# Patient Record
Sex: Female | Born: 1935
Health system: Southern US, Community
[De-identification: ages and names within clinical notes are randomized; demographics above are authoritative.]

## PROBLEM LIST (undated history)

## (undated) DIAGNOSIS — M1990 Unspecified osteoarthritis, unspecified site: Secondary | ICD-10-CM

## (undated) DIAGNOSIS — K219 Gastro-esophageal reflux disease without esophagitis: Secondary | ICD-10-CM

## (undated) DIAGNOSIS — Z8742 Personal history of other diseases of the female genital tract: Secondary | ICD-10-CM

## (undated) DIAGNOSIS — E785 Hyperlipidemia, unspecified: Secondary | ICD-10-CM

## (undated) DIAGNOSIS — N189 Chronic kidney disease, unspecified: Secondary | ICD-10-CM

## (undated) DIAGNOSIS — E039 Hypothyroidism, unspecified: Secondary | ICD-10-CM

## (undated) DIAGNOSIS — I10 Essential (primary) hypertension: Secondary | ICD-10-CM

## (undated) DIAGNOSIS — M810 Age-related osteoporosis without current pathological fracture: Secondary | ICD-10-CM

## (undated) DIAGNOSIS — R011 Cardiac murmur, unspecified: Secondary | ICD-10-CM

## (undated) HISTORY — DX: Hypothyroidism, unspecified: E03.9

## (undated) HISTORY — DX: Unspecified osteoarthritis, unspecified site: M19.90

## (undated) HISTORY — PX: JOINT REPLACEMENT: SHX530

## (undated) HISTORY — DX: Chronic kidney disease, unspecified: N18.9

## (undated) HISTORY — DX: Age-related osteoporosis without current pathological fracture: M81.0

## (undated) HISTORY — DX: Cardiac murmur, unspecified: R01.1

## (undated) HISTORY — DX: Essential (primary) hypertension: I10

## (undated) HISTORY — PX: ABDOMINAL HYSTERECTOMY: SHX81

## (undated) HISTORY — DX: Gastro-esophageal reflux disease without esophagitis: K21.9

## (undated) HISTORY — DX: Hyperlipidemia, unspecified: E78.5

## (undated) HISTORY — DX: Personal history of other diseases of the female genital tract: Z87.42

---

## 2007-05-08 ENCOUNTER — Other Ambulatory Visit: Payer: Self-pay

## 2007-05-09 ENCOUNTER — Inpatient Hospital Stay: Payer: Self-pay | Admitting: Internal Medicine

## 2011-08-17 ENCOUNTER — Ambulatory Visit: Payer: Self-pay | Admitting: Family Medicine

## 2013-08-27 ENCOUNTER — Ambulatory Visit: Payer: Self-pay | Admitting: Family Medicine

## 2014-10-21 ENCOUNTER — Ambulatory Visit: Payer: Self-pay | Admitting: Family Medicine

## 2014-11-19 DIAGNOSIS — I10 Essential (primary) hypertension: Secondary | ICD-10-CM | POA: Diagnosis not present

## 2014-12-29 DIAGNOSIS — H524 Presbyopia: Secondary | ICD-10-CM | POA: Diagnosis not present

## 2014-12-29 DIAGNOSIS — H2510 Age-related nuclear cataract, unspecified eye: Secondary | ICD-10-CM | POA: Diagnosis not present

## 2014-12-29 DIAGNOSIS — H5210 Myopia, unspecified eye: Secondary | ICD-10-CM | POA: Diagnosis not present

## 2015-01-20 DIAGNOSIS — Z0100 Encounter for examination of eyes and vision without abnormal findings: Secondary | ICD-10-CM | POA: Diagnosis not present

## 2015-03-08 IMAGING — MG MM DIGITAL SCREENING BILAT W/ CAD
1 series · 4 of 4 positions shown · non-contrast
Comparison: Previous exam(s).

CLINICAL DATA: Screening.

EXAM:
DIGITAL SCREENING BILATERAL MAMMOGRAM WITH CAD

[R CC · right · 4 of 4 slices shown]
[im 1/4]
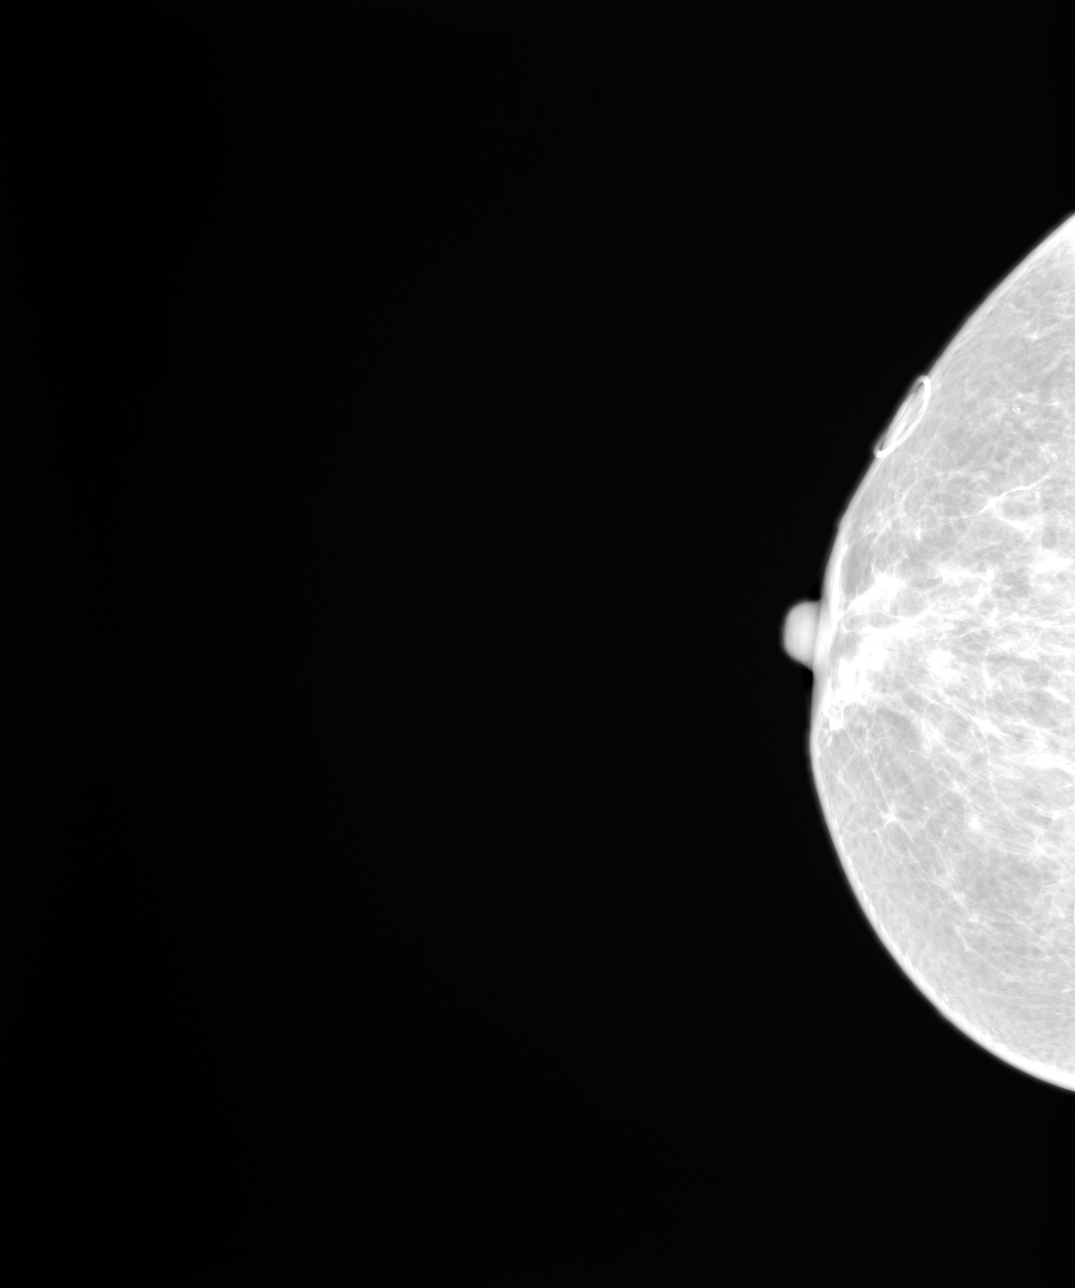
[im 2/4]
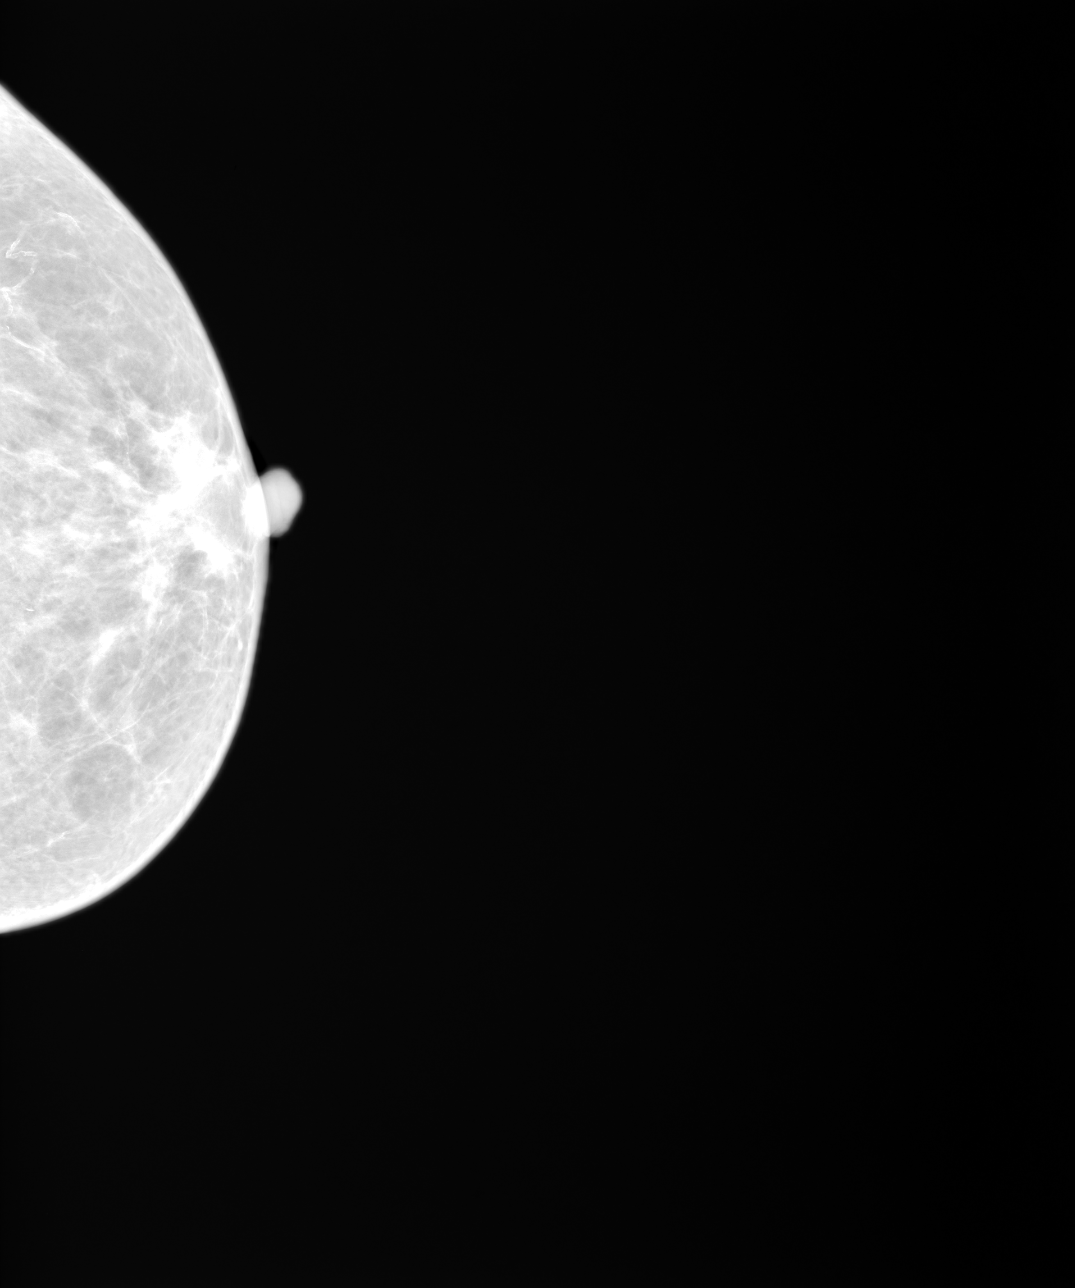
[im 3/4]
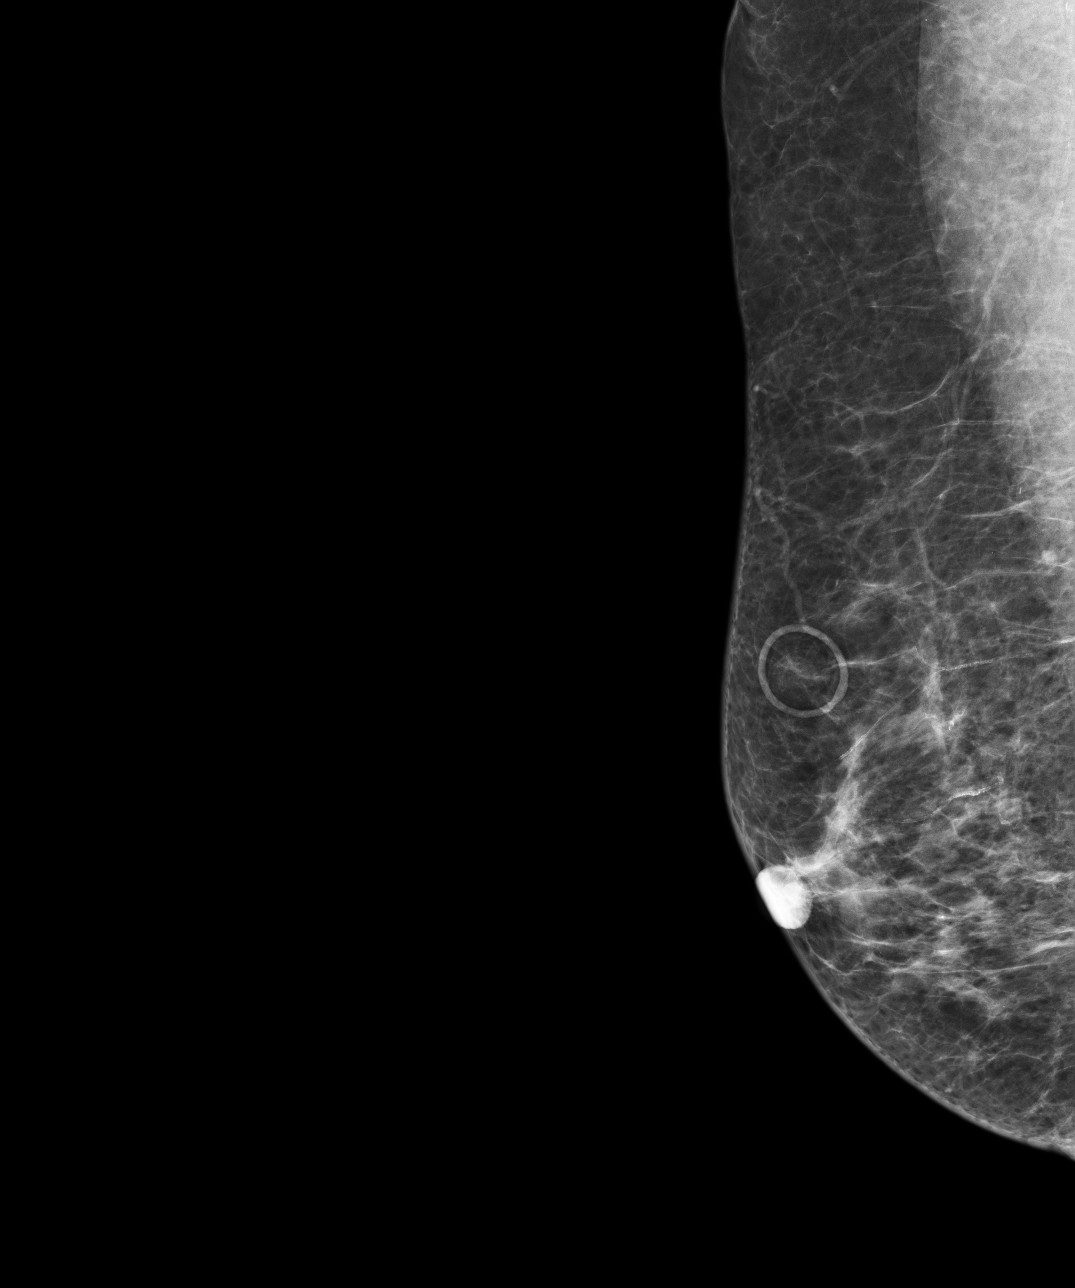
[im 4/4]
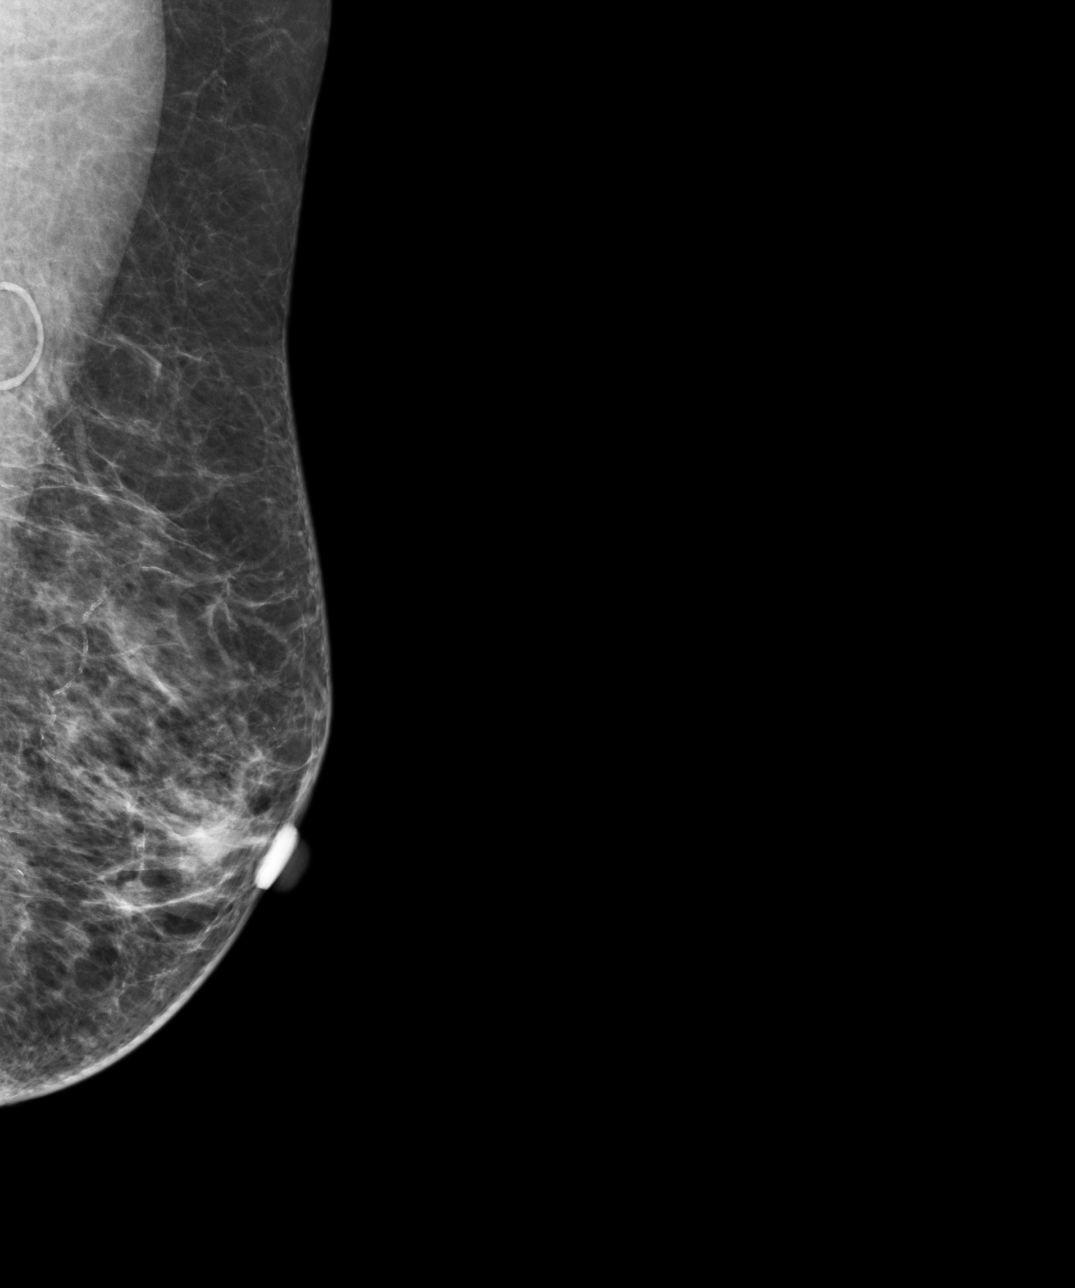

[4 of 4 positions shown; findings below may reference images not displayed]

ACR Breast Density Category c: The breasts are heterogeneously
dense, which may obscure small masses.
FINDINGS: There are no findings suspicious for malignancy. Images were
processed with CAD.
IMPRESSION: No mammographic evidence of malignancy. A result letter of this
screening mammogram will be mailed directly to the patient.

RECOMMENDATION:
Screening mammogram in one year. (Code:ZN-B-8X6)

BI-RADS CATEGORY  1: Negative

## 2015-03-15 DIAGNOSIS — E039 Hypothyroidism, unspecified: Secondary | ICD-10-CM | POA: Diagnosis not present

## 2015-03-15 DIAGNOSIS — I10 Essential (primary) hypertension: Secondary | ICD-10-CM | POA: Diagnosis not present

## 2015-03-15 DIAGNOSIS — E785 Hyperlipidemia, unspecified: Secondary | ICD-10-CM | POA: Diagnosis not present

## 2015-04-28 ENCOUNTER — Telehealth: Payer: Self-pay | Admitting: Family Medicine

## 2015-04-28 ENCOUNTER — Emergency Department
Admission: EM | Admit: 2015-04-28 | Discharge: 2015-04-28 | Disposition: A | Discharge status: Home / Self Care | Payer: Commercial Managed Care - HMO | Attending: Emergency Medicine | Admitting: Emergency Medicine

## 2015-04-28 ENCOUNTER — Ambulatory Visit (INDEPENDENT_AMBULATORY_CARE_PROVIDER_SITE_OTHER): Payer: Self-pay | Admitting: Family Medicine

## 2015-04-28 ENCOUNTER — Encounter: Payer: Self-pay | Admitting: Emergency Medicine

## 2015-04-28 ENCOUNTER — Emergency Department: Payer: Commercial Managed Care - HMO

## 2015-04-28 ENCOUNTER — Encounter: Payer: Self-pay | Admitting: Family Medicine

## 2015-04-28 VITALS — BP 171/78 | HR 57 | Temp 98.2°F | Ht 62.0 in | Wt 150.0 lb

## 2015-04-28 DIAGNOSIS — R0789 Other chest pain: Secondary | ICD-10-CM | POA: Diagnosis not present

## 2015-04-28 DIAGNOSIS — Z7982 Long term (current) use of aspirin: Secondary | ICD-10-CM | POA: Diagnosis not present

## 2015-04-28 DIAGNOSIS — N189 Chronic kidney disease, unspecified: Secondary | ICD-10-CM | POA: Insufficient documentation

## 2015-04-28 DIAGNOSIS — I10 Essential (primary) hypertension: Secondary | ICD-10-CM

## 2015-04-28 DIAGNOSIS — R011 Cardiac murmur, unspecified: Secondary | ICD-10-CM | POA: Insufficient documentation

## 2015-04-28 DIAGNOSIS — N183 Chronic kidney disease, stage 3 unspecified: Secondary | ICD-10-CM

## 2015-04-28 DIAGNOSIS — Z79899 Other long term (current) drug therapy: Secondary | ICD-10-CM | POA: Diagnosis not present

## 2015-04-28 DIAGNOSIS — K219 Gastro-esophageal reflux disease without esophagitis: Secondary | ICD-10-CM | POA: Insufficient documentation

## 2015-04-28 DIAGNOSIS — I129 Hypertensive chronic kidney disease with stage 1 through stage 4 chronic kidney disease, or unspecified chronic kidney disease: Secondary | ICD-10-CM | POA: Diagnosis not present

## 2015-04-28 DIAGNOSIS — E785 Hyperlipidemia, unspecified: Secondary | ICD-10-CM | POA: Insufficient documentation

## 2015-04-28 DIAGNOSIS — M810 Age-related osteoporosis without current pathological fracture: Secondary | ICD-10-CM | POA: Insufficient documentation

## 2015-04-28 DIAGNOSIS — E039 Hypothyroidism, unspecified: Secondary | ICD-10-CM

## 2015-04-28 DIAGNOSIS — R079 Chest pain, unspecified: Secondary | ICD-10-CM

## 2015-04-28 LAB — TROPONIN I: Troponin I: <0.03 ng/mL (ref <0.031)

## 2015-04-28 LAB — BASIC METABOLIC PANEL
Anion gap: 12 (ref 5–15)
BUN: 13 mg/dL (ref 6–20)
CHLORIDE: 105 mmol/L (ref 101–111)
CO2: 27 mmol/L (ref 22–32)
Calcium: 9.8 mg/dL (ref 8.9–10.3)
Creatinine, Ser: 1.1 mg/dL — ABNORMAL HIGH (ref 0.44–1.00)
GFR calc Af Amer: 53 mL/min — ABNORMAL LOW (ref >60)
GFR, EST NON AFRICAN AMERICAN: 46 mL/min — AB (ref >60)
GLUCOSE: 157 mg/dL — AB (ref 65–99)
POTASSIUM: 3.8 mmol/L (ref 3.5–5.1)
SODIUM: 144 mmol/L (ref 135–145)

## 2015-04-28 LAB — CBC
HCT: 37.7 % (ref 35.0–47.0)
Hemoglobin: 12 g/dL (ref 12.0–16.0)
MCH: 27.5 pg (ref 26.0–34.0)
MCHC: 31.7 g/dL — AB (ref 32.0–36.0)
MCV: 86.8 fL (ref 80.0–100.0)
PLATELETS: 240 10*3/uL (ref 150–440)
RBC: 4.35 MIL/uL (ref 3.80–5.20)
RDW: 15 % — ABNORMAL HIGH (ref 11.5–14.5)
WBC: 8.4 10*3/uL (ref 3.6–11.0)

## 2015-04-28 MED ORDER — ASPIRIN 81 MG PO CHEW
CHEWABLE_TABLET | ORAL | Status: AC
  Administered 2015-04-28: 243 mg via ORAL
  Filled 2015-04-28: qty 3

## 2015-04-28 MED ORDER — ASPIRIN 81 MG PO CHEW
243.0000 mg | CHEWABLE_TABLET | Freq: Once | ORAL | Status: AC
  Administered 2015-04-28: 243 mg via ORAL

## 2015-04-28 NOTE — ED Provider Notes (Signed)
[facility] Emergency Department Provider Note  ____________________________________________  Time seen: Approximately 2:54 PM  I have reviewed the triage vital signs and the nursing notes.   HISTORY  Chief Complaint Chest Pain    HPI Michelle Novak is a 81 y.o. female with a history of reflux, hyperlipidemia, but no known cardiac disease who presents with some intermittent, gradual onset dull pain underneath her left breast red at the bottom of her rib cage that started about 3 days ago.  She states that it is worse when she bends over and compresses her stomach.  She denies any shortness of breath, vomiting, and dysuria.  She has had some similar problems in the past but has never been found to have any cardiac issues.She has had some nausea intermittently.  She went to see her primary care doctor who reportedly had problems with her EKG machine so they sent her to the emergency department.   Past Medical History  Diagnosis Date  . Hypothyroidism   . Osteoporosis   . Hyperlipidemia   . Esophageal reflux   . Hypertension   . Chronic kidney disease   . Heart murmur   . Arthritis   . History of abnormal cervical Pap smear     Patient Active Problem List   Diagnosis Date Noted  . Hypothyroidism   . Osteoporosis   . Hyperlipidemia   . Esophageal reflux   . Hypertension   . Chronic kidney disease   . Heart murmur     Past Surgical History  Procedure Laterality Date  . Joint replacement    . Abdominal hysterectomy      Current Outpatient Rx  Name  Route  Sig  Dispense  Refill  . amitriptyline (ELAVIL) 25 MG tablet   Oral   Take 25 mg by mouth at bedtime as needed.          Marland Kitchen aspirin EC 81 MG tablet   Oral   Take 81 mg by mouth daily.         Marland Kitchen atorvastatin (LIPITOR) 10 MG tablet   Oral   Take 10 mg by mouth at bedtime.         . benazepril (LOTENSIN) 10 MG tablet   Oral   Take 10 mg by mouth daily.         Marland Kitchen  levothyroxine (SYNTHROID, LEVOTHROID) 50 MCG tablet   Oral   Take 50 mcg by mouth daily before breakfast.         . metoprolol succinate (TOPROL-XL) 50 MG 24 hr tablet   Oral   Take 50 mg by mouth daily. Take with or immediately following a meal.         . omeprazole (PRILOSEC) 20 MG capsule   Oral   Take 20 mg by mouth daily.           Allergies Review of patient's allergies indicates no known allergies.  Family History  Problem Relation Age of Onset  . Heart disease Mother   . Heart attack Mother   . Cancer Father     bone  . Heart disease Sister     Social History History  Substance Use Topics  . Smoking status: Never Smoker   . Smokeless tobacco: Never Used  . Alcohol Use: No    Review of Systems Constitutional: No fever/chills Eyes: No visual changes. ENT: No sore throat. Cardiovascular: Dull pain under her left breast at the bottom of her rib cage Respiratory: Denies shortness  of breath. Gastrointestinal: No abdominal pain.  Occasional nausea, no vomiting.  No diarrhea.  No constipation. Genitourinary: Negative for dysuria. Musculoskeletal: Negative for back pain. Skin: Negative for rash. Neurological: Negative for headaches, focal weakness or numbness.  10-point ROS otherwise negative.  ____________________________________________   PHYSICAL EXAM:  VITAL SIGNS: ED Triage Vitals  Enc Vitals Group     BP 04/28/15 1141 172/65 mmHg     Pulse Rate 04/28/15 1141 59     Resp 04/28/15 1141 18     Temp 04/28/15 1141 98.4 F (36.9 C)     Temp Source 04/28/15 1141 Oral     SpO2 04/28/15 1141 95 %     Weight 04/28/15 1141 150 lb (68.04 kg)     Height 04/28/15 1141 5\' 2"  (1.575 m)     Head Cir --      Peak Flow --      Pain Score 04/28/15 1146 0     Pain Loc --      Pain Edu? --      Excl. in [place]? --     Constitutional: Alert and oriented. Well appearing for age and in no acute distress. Eyes: Conjunctivae are normal. PERRL. EOMI. Head:  Atraumatic. Nose: No congestion/rhinnorhea. Mouth/Throat: Mucous membranes are moist.  Oropharynx non-erythematous. Neck: No stridor.   Cardiovascular: Normal rate, regular rhythm. Grossly normal heart sounds.  Good peripheral circulation. Respiratory: Normal respiratory effort.  No retractions. Lungs CTAB. Gastrointestinal: Soft and nontender. No distention. No abdominal bruits. No CVA tenderness. Musculoskeletal: No lower extremity tenderness nor edema.  No joint effusions. Neurologic:  Normal speech and language. No gross focal neurologic deficits are appreciated. Speech is normal. Skin:  Skin is warm, dry and intact. No rash noted.   ____________________________________________   LABS (all labs ordered are listed, but only abnormal results are displayed)  Labs Reviewed  CBC - Abnormal; Notable for the following:    MCHC 31.7 (*)    RDW 15.0 (*)    All other components within normal limits  BASIC METABOLIC PANEL - Abnormal; Notable for the following:    Glucose, Bld 157 (*)    Creatinine, Ser 1.10 (*)    GFR calc non Af Amer 46 (*)    GFR calc Af Amer 53 (*)    All other components within normal limits  TROPONIN I   ____________________________________________  EKG  ED ECG REPORT I, Rayvin Abid, the attending physician, personally viewed and interpreted this ECG.   Date: 04/28/2015  EKG Time: 11:45  Rate: 62  Rhythm: normal sinus rhythm  Axis: Left axis deviation  Intervals:Normal  ST&T Change: Non-specific ST segment / T-wave changes, but no evidence of acute ischemia.  ____________________________________________  RADIOLOGY  I, Karelly Dewalt, personally viewed and evaluated these images as part of my medical decision making.   Dg Chest 2 View  04/28/2015   CLINICAL DATA:  Initial encounter for discomfort and tightness under left breast for 3 days.  EXAM: CHEST  2 VIEW  COMPARISON:  05/08/2007  FINDINGS: The heart size and mediastinal contours are within  normal limits. Both lungs are clear. The visualized skeletal structures are unremarkable.  IMPRESSION: No active cardiopulmonary disease.   Electronically Signed   By: Misty Stanley M.D.   On: 04/28/2015 13:24    ____________________________________________   PROCEDURES  Procedure(s) performed: None  Critical Care performed: None ____________________________________________   INITIAL IMPRESSION / ASSESSMENT AND PLAN / ED COURSE  Pertinent labs & imaging results that were available  during my care of the patient were reviewed by me and considered in my medical decision making (see chart for details).  The patient is presenting with atypical chest pain which I suspect may actually be some gastric upset given the fact that it is located right around the location of her stomach and that it is worse when she bends over and compresses that region.  In spite of her age she has few risk factors for ACS and her pain symptoms are not consistent with ACS.  She has also been worked up for similar issues in the past.  Her workup today in the emergency department is reassuring.  I discussed this with the patient and her daughter and did offer admission, but we all agree that that is not indicated at this time.  The patient will follow up with her regular doctor at the next available appointment.  She understands my usual and customary return precautions should her symptoms get worse.   ____________________________________________  FINAL CLINICAL IMPRESSION(S) / ED DIAGNOSES  Final diagnoses:  Atypical chest pain      NEW MEDICATIONS STARTED DURING THIS VISIT:  New Prescriptions   No medications on file     Hinda Kehr, MD 04/29/15 [id number]

## 2015-04-28 NOTE — Progress Notes (Signed)
BP 171/78 mmHg  Pulse 57  Temp(Src) 98.2 F (36.8 C)  Ht 5\' 2"  (1.575 m)  Wt 150 lb (68.04 kg)  BMI 27.43 kg/m2  SpO2 99%   Subjective:    Patient ID: Michelle Novak, female    DOB: 1932-03-04, 81 y.o.   MRN: [id number]  HPI: Michelle Novak is a 81 y.o. female  Chief Complaint  Patient presents with  . Rib Pain    under breast off and on since Sunday   Pain under the left breast and got a little sore; has been off and on for the last five days;  She also had nausea on Sunday morning  Had diarrhea over the weekend on Sunday too; stomach has been "griping" Patient has no heart history but it runs in her family (son and sister and mother) Mother had a heart attack at age 78 and died (patient is 81 years old) Younger sister had open heart surgery (clarified a bypass) in her fifties I asked about any other changes in her health; she says her thyroid medicine has been changed, new problem over the last 6 months or so  Outpatient Encounter Prescriptions as of 04/28/2015  Medication Sig  . amitriptyline (ELAVIL) 25 MG tablet Take 25 mg by mouth at bedtime as needed.   Marland Kitchen aspirin EC 81 MG tablet Take 81 mg by mouth daily.  Marland Kitchen atorvastatin (LIPITOR) 10 MG tablet Take 10 mg by mouth at bedtime.  . benazepril (LOTENSIN) 10 MG tablet Take 10 mg by mouth daily.  Marland Kitchen levothyroxine (SYNTHROID, LEVOTHROID) 50 MCG tablet Take 50 mcg by mouth daily before breakfast.  . metoprolol succinate (TOPROL-XL) 50 MG 24 hr tablet Take 50 mg by mouth daily. Take with or immediately following a meal.  . omeprazole (PRILOSEC) 20 MG capsule Take 20 mg by mouth daily.   No facility-administered encounter medications on file as of 04/28/2015.     Relevant past medical, surgical, family and social history reviewed and updated as indicated. Interim medical history since our last visit reviewed. Family History  Problem Relation Age of Onset  . Heart disease Mother   . Heart attack Mother   . Cancer  Father     bone  . Heart disease Sister    Allergies and medications reviewed and updated.  Review of Systems  Respiratory: Positive for cough (from her BP medicine she says). Negative for shortness of breath.   Gastrointestinal: Positive for nausea, abdominal pain and diarrhea.   Per HPI unless specifically indicated above     Objective:    BP 171/78 mmHg  Pulse 57  Temp(Src) 98.2 F (36.8 C)  Ht 5\' 2"  (1.575 m)  Wt 150 lb (68.04 kg)  BMI 27.43 kg/m2  SpO2 99%  Wt Readings from Last 3 Encounters:  04/28/15 150 lb (68.04 kg)  03/15/15 151 lb (68.493 kg)  04/28/15 150 lb (68.04 kg)   Physical Exam  Constitutional: She appears well-developed and well-nourished. No distress.  elderly  HENT:  Head: Normocephalic and atraumatic.  Eyes: EOM are normal. No scleral icterus.  Neck: No JVD present. No thyromegaly present.  Cardiovascular: Regular rhythm and normal heart sounds.  Bradycardia present.   No murmur heard. Pulmonary/Chest: Effort normal and breath sounds normal. No accessory muscle usage. No tachypnea. No respiratory distress. She has no wheezes.  Abdominal: Soft. Bowel sounds are normal. She exhibits no distension.  Musculoskeletal: Normal range of motion. She exhibits no edema.  Neurological: She is alert. She  exhibits normal muscle tone.  Skin: Skin is warm and dry. No ecchymosis noted. She is not diaphoretic. No cyanosis. No pallor.  Psychiatric: She has a normal mood and affect. Her behavior is normal. Judgment and thought content normal. Cognition and memory are not impaired.      Assessment & Plan:   Problem List Items Addressed This Visit      Cardiovascular and Mediastinum   Hypertension    Uncontrolled today; unusual for patient; reviewed previous BP readings in [facility] chart        Endocrine   Hypothyroidism    Relative new problem per patient; dose adjustment may be needed if over-replaced; going to ER and TSH will likely be checked  there        Genitourinary   Chronic kidney disease    Increases risk of cardiovascular event       Other Visit Diagnoses    Chest pain, unspecified chest pain type    -  Primary    Unable to get a full EKG here; few leads available interpreted, T wave inversion III and aVF; O2 started, she's had ASA; 911 called, talked to daughter, pt to E       Follow up plan: Return if symptoms worsen or fail to improve.

## 2015-04-28 NOTE — Telephone Encounter (Signed)
Ok. Chart abstracted.

## 2015-04-28 NOTE — ED Notes (Signed)
Patient arrives from a PCP office with c/o chest pain. Intermittent in course since Sunday. + dizziness. Pain located below the left breast

## 2015-04-28 NOTE — Discharge Instructions (Signed)
You have been seen in the Emergency Department (ED) today for chest pain.  As we have discussed todays test results are normal, but you may require further testing.  Please follow up with the recommended doctor as instructed above in these documents regarding todays emergent visit and your recent symptoms to discuss further management.  Continue to take your regular medications. If you are not doing so already, please also take a daily baby aspirin (81 mg), at least until you follow up with your doctor.  Return to the Emergency Department (ED) if you experience any further chest pain/pressure/tightness, difficulty breathing, or sudden sweating, or other symptoms that concern you.   Chest Pain (Nonspecific) It is often hard to give a diagnosis for the cause of chest pain. There is always a chance that your pain could be related to something serious, such as a heart attack or a blood clot in the lungs. You need to follow up with your doctor. HOME CARE  If antibiotic medicine was given, take it as directed by your doctor. Finish the medicine even if you start to feel better.  For the next few days, avoid activities that bring on chest pain. Continue physical activities as told by your doctor.  Do not use any tobacco products. This includes cigarettes, chewing tobacco, and e-cigarettes.  Avoid drinking alcohol.  Only take medicine as told by your doctor.  Follow your doctor's suggestions for more testing if your chest pain does not go away.  Keep all doctor visits you made. GET HELP IF:  Your chest pain does not go away, even after treatment.  You have a rash with blisters on your chest.  You have a fever. GET HELP RIGHT AWAY IF:   You have more pain or pain that spreads to your arm, neck, jaw, back, or belly (abdomen).  You have shortness of breath.  You cough more than usual or cough up blood.  You have very bad back or belly pain.  You feel sick to your stomach (nauseous) or  throw up (vomit).  You have very bad weakness.  You pass out (faint).  You have chills. This is an emergency. Do not wait to see if the problems will go away. Call your local emergency services (911 in [place] Do not drive yourself to the hospital. MAKE SURE YOU:   Understand these instructions.  Will watch your condition.  Will get help right away if you are not doing well or get worse. Document Released: 04/10/2008 Document Revised: 10/28/2013 Document Reviewed: 04/10/2008 [facility] Patient Information 2015 [place], [place]. This information is not intended to replace advice given to you by your health care provider. Make sure you discuss any questions you have with your health care provider.

## 2015-04-28 NOTE — Patient Instructions (Signed)
Patient taken by EMS to [facility] ER, no check-out at front desk done

## 2015-04-28 NOTE — ED Notes (Signed)
Pt denies chest pain at this time.

## 2015-04-28 NOTE — Assessment & Plan Note (Signed)
Increases risk of cardiovascular event

## 2015-04-28 NOTE — Assessment & Plan Note (Signed)
Relative new problem per patient; dose adjustment may be needed if over-replaced; going to ER and TSH will likely be checked there

## 2015-04-28 NOTE — Assessment & Plan Note (Signed)
Uncontrolled today; unusual for patient; reviewed previous BP readings in [facility] chart

## 2015-04-28 NOTE — Telephone Encounter (Signed)
Pt has been added to Dr. Delight Ovens schedule. She is an acute patient. Pt's appt is today 04/28/15 @ 9:30am. Thanks

## 2015-09-13 ENCOUNTER — Other Ambulatory Visit: Payer: Self-pay | Admitting: Family Medicine

## 2015-09-28 ENCOUNTER — Ambulatory Visit (INDEPENDENT_AMBULATORY_CARE_PROVIDER_SITE_OTHER): Payer: Commercial Managed Care - HMO | Admitting: Family Medicine

## 2015-09-28 ENCOUNTER — Encounter: Payer: Self-pay | Admitting: Family Medicine

## 2015-09-28 VITALS — BP 117/69 | HR 56 | Temp 98.9°F | Ht 62.0 in | Wt 147.0 lb

## 2015-09-28 DIAGNOSIS — E785 Hyperlipidemia, unspecified: Secondary | ICD-10-CM

## 2015-09-28 DIAGNOSIS — I129 Hypertensive chronic kidney disease with stage 1 through stage 4 chronic kidney disease, or unspecified chronic kidney disease: Secondary | ICD-10-CM | POA: Diagnosis not present

## 2015-09-28 DIAGNOSIS — E039 Hypothyroidism, unspecified: Secondary | ICD-10-CM | POA: Diagnosis not present

## 2015-09-28 DIAGNOSIS — I10 Essential (primary) hypertension: Secondary | ICD-10-CM

## 2015-09-28 DIAGNOSIS — Z0000 Encounter for general adult medical examination without abnormal findings: Secondary | ICD-10-CM | POA: Diagnosis not present

## 2015-09-28 DIAGNOSIS — N183 Chronic kidney disease, stage 3 unspecified: Secondary | ICD-10-CM

## 2015-09-28 LAB — MICROSCOPIC EXAMINATION

## 2015-09-28 LAB — URINALYSIS, ROUTINE W REFLEX MICROSCOPIC
BILIRUBIN UA: NEGATIVE
GLUCOSE, UA: NEGATIVE
Ketones, UA: NEGATIVE
Nitrite, UA: POSITIVE — AB
Protein, UA: NEGATIVE
Specific Gravity, UA: 1.015 (ref 1.005–1.030)
Urobilinogen, Ur: 0.2 mg/dL (ref 0.2–1.0)
pH, UA: 5.5 (ref 5.0–7.5)

## 2015-09-28 MED ORDER — OMEPRAZOLE 20 MG PO CPDR: 20.0000 mg | DELAYED_RELEASE_CAPSULE | Freq: Every day | ORAL | Status: DC

## 2015-09-28 MED ORDER — AMITRIPTYLINE HCL 25 MG PO TABS: 25.0000 mg | ORAL_TABLET | Freq: Every evening | ORAL | Status: DC | PRN

## 2015-09-28 MED ORDER — LEVOTHYROXINE SODIUM 50 MCG PO TABS: 50.0000 ug | ORAL_TABLET | Freq: Every day | ORAL | Status: DC

## 2015-09-28 MED ORDER — BENAZEPRIL HCL 10 MG PO TABS: 10.0000 mg | ORAL_TABLET | Freq: Every day | ORAL | Status: DC

## 2015-09-28 MED ORDER — HYDROCHLOROTHIAZIDE 25 MG PO TABS: 25.0000 mg | ORAL_TABLET | Freq: Every day | ORAL | Status: DC

## 2015-09-28 MED ORDER — ATORVASTATIN CALCIUM 10 MG PO TABS
10.0000 mg | ORAL_TABLET | Freq: Every day | ORAL | Status: DC
Start: 2015-09-28 — End: 2016-10-02

## 2015-09-28 MED ORDER — METOPROLOL SUCCINATE ER 50 MG PO TB24: 50.0000 mg | ORAL_TABLET | Freq: Every day | ORAL | Status: DC

## 2015-09-28 NOTE — Assessment & Plan Note (Signed)
The current medical regimen is effective;  continue present plan and medications.  

## 2015-09-28 NOTE — Progress Notes (Signed)
BP 117/69 mmHg  Pulse 56  Temp(Src) 98.9 F (37.2 C)  Ht 5\' 2"  (1.575 m)  Wt 147 lb (66.679 kg)  BMI 26.88 kg/m2  SpO2 99%   Subjective:    Patient ID: Michelle Novak, female    DOB: 12/03/1931, 81 y.o.   MRN: [id number]  HPI: Michelle Novak is a 81 y.o. female  Chief Complaint  Patient presents with  . Annual Exam   patient doing well with good control of blood pressure no side effects from medications for blood pressure cholesterol, is having a little bit of nausea with taking thyroid pills by itself in the morning with water Reflux stable on omeprazole Occasionally using amitriptyline for sleep and does OK.  Relevant past medical, surgical, family and social history reviewed and updated as indicated. Interim medical history since our last visit reviewed. Allergies and medications reviewed and updated.  Review of Systems  Constitutional: Negative.   HENT: Negative.   Eyes: Negative.   Respiratory: Negative.   Cardiovascular: Negative.   Gastrointestinal: Negative.   Endocrine: Negative.   Genitourinary: Negative.   Musculoskeletal: Negative.   Skin: Negative.   Allergic/Immunologic: Negative.   Neurological: Negative.   Hematological: Negative.   Psychiatric/Behavioral: Negative.     Per HPI unless specifically indicated above     Objective:    BP 117/69 mmHg  Pulse 56  Temp(Src) 98.9 F (37.2 C)  Ht 5\' 2"  (1.575 m)  Wt 147 lb (66.679 kg)  BMI 26.88 kg/m2  SpO2 99%  Wt Readings from Last 3 Encounters:  09/28/15 147 lb (66.679 kg)  04/28/15 150 lb (68.04 kg)  04/28/15 150 lb (68.04 kg)    Physical Exam  Constitutional: She is oriented to person, place, and time. She appears well-developed and well-nourished.  HENT:  Head: Normocephalic and atraumatic.  Right Ear: External ear normal.  Left Ear: External ear normal.  Nose: Nose normal.  Mouth/Throat: Oropharynx is clear and moist.  Eyes: Conjunctivae and EOM are normal. Pupils are  equal, round, and reactive to light.  Neck: Normal range of motion. Neck supple. Carotid bruit is not present.  Cardiovascular: Normal rate, regular rhythm and normal heart sounds.   No murmur heard. Pulmonary/Chest: Effort normal and breath sounds normal. She exhibits no mass. Right breast exhibits no mass, no skin change and no tenderness. Left breast exhibits no mass, no skin change and no tenderness. Breasts are symmetrical.  Abdominal: Soft. Bowel sounds are normal. There is no hepatosplenomegaly.  Musculoskeletal: Normal range of motion.  Neurological: She is alert and oriented to person, place, and time.  Skin: No rash noted.  Psychiatric: She has a normal mood and affect. Her behavior is normal. Judgment and thought content normal.    Results for orders placed or performed during the hospital encounter of 04/28/15  CBC  Result Value Ref Range   WBC 8.4 3.6 - 11.0 K/uL   RBC 4.35 3.80 - 5.20 MIL/uL   Hemoglobin 12.0 12.0 - 16.0 g/dL   HCT 37.7 35.0 - 47.0 %   MCV 86.8 80.0 - 100.0 fL   MCH 27.5 26.0 - 34.0 pg   MCHC 31.7 (L) 32.0 - 36.0 g/dL   RDW 15.0 (H) 11.5 - 14.5 %   Platelets 240 150 - 440 K/uL  Basic metabolic panel  Result Value Ref Range   Sodium 144 135 - 145 mmol/L   Potassium 3.8 3.5 - 5.1 mmol/L   Chloride 105 101 - 111 mmol/L  CO2 27 22 - 32 mmol/L   Glucose, Bld 157 (H) 65 - 99 mg/dL   BUN 13 6 - 20 mg/dL   Creatinine, Ser 1.10 (H) 0.44 - 1.00 mg/dL   Calcium 9.8 8.9 - 10.3 mg/dL   GFR calc non Af Amer 46 (L) >60 mL/min   GFR calc Af Amer 53 (L) >60 mL/min   Anion gap 12 5 - 15  Troponin I  Result Value Ref Range   Troponin I <0.03 <0.031 ng/mL      Assessment & Plan:   Problem List Items Addressed This Visit      Cardiovascular and Mediastinum   Hypertension - Primary    The current medical regimen is effective;  continue present plan and medications.       Relevant Medications   atorvastatin (LIPITOR) 10 MG tablet   benazepril (LOTENSIN)  10 MG tablet   metoprolol succinate (TOPROL-XL) 50 MG 24 hr tablet   hydrochlorothiazide (HYDRODIURIL) 25 MG tablet   Other Relevant Orders   Comprehensive metabolic panel   CBC with Differential/Platelet   TSH   Urinalysis, Routine w reflex microscopic (not at [facility])     Endocrine   Hypothyroidism    The current medical regimen is effective;  continue present plan and medications.       Relevant Medications   levothyroxine (SYNTHROID, LEVOTHROID) 50 MCG tablet   metoprolol succinate (TOPROL-XL) 50 MG 24 hr tablet   Other Relevant Orders   Comprehensive metabolic panel   CBC with Differential/Platelet   TSH   Urinalysis, Routine w reflex microscopic (not at [facility])     Genitourinary   Hypertensive kidney disease with CKD stage III     Other   Hyperlipidemia    The current medical regimen is effective;  continue present plan and medications.       Relevant Medications   atorvastatin (LIPITOR) 10 MG tablet   benazepril (LOTENSIN) 10 MG tablet   metoprolol succinate (TOPROL-XL) 50 MG 24 hr tablet   hydrochlorothiazide (HYDRODIURIL) 25 MG tablet   Other Relevant Orders   Comprehensive metabolic panel   Lipid panel   CBC with Differential/Platelet   TSH   Urinalysis, Routine w reflex microscopic (not at [facility])    Other Visit Diagnoses    PE (physical exam), annual            Follow up plan: Return in about 6 months (around 03/27/2016), or if symptoms worsen or fail to improve, for BMP, lipids, ALT, AST.

## 2015-09-29 ENCOUNTER — Telehealth: Payer: Self-pay | Admitting: Family Medicine

## 2015-09-29 DIAGNOSIS — N183 Chronic kidney disease, stage 3 unspecified: Secondary | ICD-10-CM

## 2015-09-29 LAB — CBC WITH DIFFERENTIAL/PLATELET
BASOS ABS: 0 10*3/uL (ref 0.0–0.2)
BASOS: 0 %
EOS (ABSOLUTE): 0.1 10*3/uL (ref 0.0–0.4)
Eos: 1 %
Hematocrit: 33.1 % — ABNORMAL LOW (ref 34.0–46.6)
Hemoglobin: 11 g/dL — ABNORMAL LOW (ref 11.1–15.9)
IMMATURE GRANS (ABS): 0 10*3/uL (ref 0.0–0.1)
Immature Granulocytes: 0 %
LYMPHS: 48 %
Lymphocytes Absolute: 3.5 10*3/uL — ABNORMAL HIGH (ref 0.7–3.1)
MCH: 28.7 pg (ref 26.6–33.0)
MCHC: 33.2 g/dL (ref 31.5–35.7)
MCV: 86 fL (ref 79–97)
Monocytes Absolute: 0.5 10*3/uL (ref 0.1–0.9)
Monocytes: 6 %
NEUTROS PCT: 45 %
Neutrophils Absolute: 3.3 10*3/uL (ref 1.4–7.0)
PLATELETS: 272 10*3/uL (ref 150–379)
RBC: 3.83 x10E6/uL (ref 3.77–5.28)
RDW: 13.9 % (ref 12.3–15.4)
WBC: 7.4 10*3/uL (ref 3.4–10.8)

## 2015-09-29 LAB — COMPREHENSIVE METABOLIC PANEL
ALK PHOS: 78 IU/L (ref 39–117)
ALT: 18 IU/L (ref 0–32)
AST: 21 IU/L (ref 0–40)
Albumin/Globulin Ratio: 1.8 (ref 1.1–2.5)
Albumin: 4.6 g/dL (ref 3.5–4.7)
BILIRUBIN TOTAL: 0.4 mg/dL (ref 0.0–1.2)
BUN / CREAT RATIO: 14 (ref 11–26)
BUN: 20 mg/dL (ref 8–27)
CO2: 24 mmol/L (ref 18–29)
CREATININE: 1.42 mg/dL — AB (ref 0.57–1.00)
Calcium: 9.9 mg/dL (ref 8.7–10.3)
Chloride: 101 mmol/L (ref 97–106)
GFR calc Af Amer: 40 mL/min/{1.73_m2} — ABNORMAL LOW (ref >59)
GFR calc non Af Amer: 35 mL/min/{1.73_m2} — ABNORMAL LOW (ref >59)
GLOBULIN, TOTAL: 2.5 g/dL (ref 1.5–4.5)
Glucose: 89 mg/dL (ref 65–99)
POTASSIUM: 3.8 mmol/L (ref 3.5–5.2)
Sodium: 142 mmol/L (ref 136–144)
Total Protein: 7.1 g/dL (ref 6.0–8.5)

## 2015-09-29 LAB — LIPID PANEL
CHOLESTEROL TOTAL: 187 mg/dL (ref 100–199)
Chol/HDL Ratio: 3.6 ratio units (ref 0.0–4.4)
HDL: 52 mg/dL (ref >39)
LDL CALC: 114 mg/dL — AB (ref 0–99)
Triglycerides: 107 mg/dL (ref 0–149)
VLDL Cholesterol Cal: 21 mg/dL (ref 5–40)

## 2015-09-29 LAB — TSH: TSH: 0.496 u[IU]/mL (ref 0.450–4.500)

## 2015-09-29 NOTE — Telephone Encounter (Signed)
-----   Message from Wynn Maudlin, [place] sent at 09/29/2015 11:33 AM EST ----- labs

## 2015-09-29 NOTE — Telephone Encounter (Signed)
Phone call Discussed with patient renal function slightly down and hemoglobin slightly down Patient avoided nonsteroidals Will recheck in a month or so CBC and BMP.

## 2015-12-21 ENCOUNTER — Other Ambulatory Visit: Payer: Commercial Managed Care - HMO

## 2015-12-21 DIAGNOSIS — N183 Chronic kidney disease, stage 3 unspecified: Secondary | ICD-10-CM

## 2015-12-22 ENCOUNTER — Encounter: Payer: Self-pay | Admitting: Family Medicine

## 2015-12-22 LAB — CBC WITH DIFFERENTIAL/PLATELET
Basophils Absolute: 0 10*3/uL (ref 0.0–0.2)
Basos: 0 %
EOS (ABSOLUTE): 0.1 10*3/uL (ref 0.0–0.4)
Eos: 2 %
HEMOGLOBIN: 10.6 g/dL — AB (ref 11.1–15.9)
Hematocrit: 33.8 % — ABNORMAL LOW (ref 34.0–46.6)
Immature Grans (Abs): 0 10*3/uL (ref 0.0–0.1)
Immature Granulocytes: 0 %
LYMPHS ABS: 4.3 10*3/uL — AB (ref 0.7–3.1)
Lymphs: 54 %
MCH: 27.2 pg (ref 26.6–33.0)
MCHC: 31.4 g/dL — AB (ref 31.5–35.7)
MCV: 87 fL (ref 79–97)
MONOS ABS: 0.4 10*3/uL (ref 0.1–0.9)
Monocytes: 5 %
Neutrophils Absolute: 3 10*3/uL (ref 1.4–7.0)
Neutrophils: 39 %
Platelets: 267 10*3/uL (ref 150–379)
RBC: 3.89 x10E6/uL (ref 3.77–5.28)
RDW: 15 % (ref 12.3–15.4)
WBC: 7.8 10*3/uL (ref 3.4–10.8)

## 2015-12-22 LAB — BASIC METABOLIC PANEL
BUN / CREAT RATIO: 13 (ref 11–26)
BUN: 17 mg/dL (ref 8–27)
CO2: 27 mmol/L (ref 18–29)
Calcium: 9.8 mg/dL (ref 8.7–10.3)
Chloride: 101 mmol/L (ref 96–106)
Creatinine, Ser: 1.36 mg/dL — ABNORMAL HIGH (ref 0.57–1.00)
GFR calc Af Amer: 42 mL/min/{1.73_m2} — ABNORMAL LOW (ref >59)
GFR, EST NON AFRICAN AMERICAN: 37 mL/min/{1.73_m2} — AB (ref >59)
Glucose: 95 mg/dL (ref 65–99)
POTASSIUM: 3.9 mmol/L (ref 3.5–5.2)
Sodium: 141 mmol/L (ref 134–144)

## 2016-01-03 DIAGNOSIS — H524 Presbyopia: Secondary | ICD-10-CM | POA: Diagnosis not present

## 2016-01-03 DIAGNOSIS — H35023 Exudative retinopathy, bilateral: Secondary | ICD-10-CM | POA: Diagnosis not present

## 2016-01-03 DIAGNOSIS — H5210 Myopia, unspecified eye: Secondary | ICD-10-CM | POA: Diagnosis not present

## 2016-03-30 ENCOUNTER — Encounter: Payer: Self-pay | Admitting: Family Medicine

## 2016-03-30 ENCOUNTER — Ambulatory Visit (INDEPENDENT_AMBULATORY_CARE_PROVIDER_SITE_OTHER): Payer: Commercial Managed Care - HMO | Admitting: Family Medicine

## 2016-03-30 VITALS — BP 112/70 | HR 63 | Temp 97.8°F | Ht 62.3 in | Wt 139.0 lb

## 2016-03-30 DIAGNOSIS — E785 Hyperlipidemia, unspecified: Secondary | ICD-10-CM | POA: Diagnosis not present

## 2016-03-30 DIAGNOSIS — R002 Palpitations: Secondary | ICD-10-CM | POA: Diagnosis not present

## 2016-03-30 DIAGNOSIS — I129 Hypertensive chronic kidney disease with stage 1 through stage 4 chronic kidney disease, or unspecified chronic kidney disease: Secondary | ICD-10-CM | POA: Diagnosis not present

## 2016-03-30 DIAGNOSIS — N183 Chronic kidney disease, stage 3 (moderate): Secondary | ICD-10-CM | POA: Diagnosis not present

## 2016-03-30 LAB — LP+ALT+AST PICCOLO, WAIVED
ALT (SGPT) PICCOLO, WAIVED: 23 U/L (ref 10–47)
AST (SGOT) Piccolo, Waived: 27 U/L (ref 11–38)
Chol/HDL Ratio Piccolo,Waive: 3.1 mg/dL
Cholesterol Piccolo, Waived: 188 mg/dL (ref <200)
HDL CHOL PICCOLO, WAIVED: 61 mg/dL (ref >59)
LDL Chol Calc Piccolo Waived: 103 mg/dL — ABNORMAL HIGH (ref <100)
Triglycerides Piccolo,Waived: 117 mg/dL (ref <150)
VLDL CHOL CALC PICCOLO,WAIVE: 23 mg/dL (ref <30)

## 2016-03-30 NOTE — Progress Notes (Signed)
BP 112/70 mmHg  Pulse 63  Temp(Src) 97.8 F (36.6 C)  Ht 5' 2.3" (1.582 m)  Wt 139 lb (63.05 kg)  BMI 25.19 kg/m2  SpO2 96%   Subjective:    Patient ID: Michelle Novak, female    DOB: Mar 19, 1932, 82 y.o.   MRN: [id number]  HPI: Michelle Novak is a 82 y.o. female  Chief Complaint  Patient presents with  . Hypertension  . Hyperlipidemia  . Hypothyroidism  Patient recheck medical problems doing well with no complaints good control with blood pressure cholesterol. Taking medications faithfully without problems.   skin tag which is large dangling occasionally gets caught and bleed in her right neck this was destroyed with cryotherapy   Patient also with some nonspecific palpitations noticed sometimes with exertion and sometimes at rest goes away after a few minutes doesn't happen every day no other associated symptoms.  Relevant past medical, surgical, family and social history reviewed and updated as indicated. Interim medical history since our last visit reviewed. Allergies and medications reviewed and updated.  Other than noted above Review of Systems  Constitutional: Negative.   Respiratory: Negative.   Cardiovascular: Positive for palpitations. Negative for chest pain and leg swelling.    Per HPI unless specifically indicated above     Objective:    BP 112/70 mmHg  Pulse 63  Temp(Src) 97.8 F (36.6 C)  Ht 5' 2.3" (1.582 m)  Wt 139 lb (63.05 kg)  BMI 25.19 kg/m2  SpO2 96%  Wt Readings from Last 3 Encounters:  03/30/16 139 lb (63.05 kg)  09/28/15 147 lb (66.679 kg)  04/28/15 150 lb (68.04 kg)    Physical Exam  Constitutional: She is oriented to person, place, and time. She appears well-developed and well-nourished. No distress.  HENT:  Head: Normocephalic and atraumatic.  Right Ear: Hearing normal.  Left Ear: Hearing normal.  Nose: Nose normal.  Eyes: Conjunctivae and lids are normal. Right eye exhibits no discharge. Left eye exhibits no  discharge. No scleral icterus.  Cardiovascular: Normal rate and regular rhythm.   Pulmonary/Chest: Effort normal and breath sounds normal. No respiratory distress.  Musculoskeletal: Normal range of motion.  Neurological: She is alert and oriented to person, place, and time.  Skin: Skin is intact. No rash noted.  Psychiatric: She has a normal mood and affect. Her speech is normal and behavior is normal. Judgment and thought content normal. Cognition and memory are normal.    Results for orders placed or performed in visit on 12/21/15  CBC with Differential/Platelet  Result Value Ref Range   WBC 7.8 3.4 - 10.8 x10E3/uL   RBC 3.89 3.77 - 5.28 x10E6/uL   Hemoglobin 10.6 (L) 11.1 - 15.9 g/dL   Hematocrit 33.8 (L) 34.0 - 46.6 %   MCV 87 79 - 97 fL   MCH 27.2 26.6 - 33.0 pg   MCHC 31.4 (L) 31.5 - 35.7 g/dL   RDW 15.0 12.3 - 15.4 %   Platelets 267 150 - 379 x10E3/uL   Neutrophils 39 %   Lymphs 54 %   Monocytes 5 %   Eos 2 %   Basos 0 %   Neutrophils Absolute 3.0 1.4 - 7.0 x10E3/uL   Lymphocytes Absolute 4.3 (H) 0.7 - 3.1 x10E3/uL   Monocytes Absolute 0.4 0.1 - 0.9 x10E3/uL   EOS (ABSOLUTE) 0.1 0.0 - 0.4 x10E3/uL   Basophils Absolute 0.0 0.0 - 0.2 x10E3/uL   Immature Granulocytes 0 %   Immature Grans (Abs) 0.0 0.0 -  0.1 [id number]  Basic metabolic panel  Result Value Ref Range   Glucose 95 65 - 99 mg/dL   BUN 17 8 - 27 mg/dL   Creatinine, Ser 1.36 (H) 0.57 - 1.00 mg/dL   GFR calc non Af Amer 37 (L) >59 mL/min/1.73   GFR calc Af Amer 42 (L) >59 mL/min/1.73   BUN/Creatinine Ratio 13 11 - 26   Sodium 141 134 - 144 mmol/L   Potassium 3.9 3.5 - 5.2 mmol/L   Chloride 101 96 - 106 mmol/L   CO2 27 18 - 29 mmol/L   Calcium 9.8 8.7 - 10.3 mg/dL      Assessment & Plan:   Problem List Items Addressed This Visit      Genitourinary   Hypertensive kidney disease with CKD stage III    The current medical regimen is effective;  continue present plan and medications.       Relevant  Orders   Basic metabolic panel   LP+ALT+AST Piccolo, Waived     Other   Hyperlipidemia - Primary    The current medical regimen is effective;  continue present plan and medications. a      Relevant Orders   Basic metabolic panel   LP+ALT+AST Piccolo, Waived   Palpitations    No acute changes EKG      Relevant Orders   EKG 12-Lead (Completed)       Follow up plan: Return in about 6 months (around 09/30/2016) for Physical Exam.

## 2016-03-30 NOTE — Assessment & Plan Note (Signed)
The current medical regimen is effective;  continue present plan and medications.  

## 2016-03-30 NOTE — Assessment & Plan Note (Signed)
No acute changes EKG

## 2016-03-30 NOTE — Assessment & Plan Note (Signed)
The current medical regimen is effective;  continue present plan and medications. a 

## 2016-03-31 LAB — BASIC METABOLIC PANEL
BUN / CREAT RATIO: 16 (ref 12–28)
BUN: 23 mg/dL (ref 8–27)
CHLORIDE: 100 mmol/L (ref 96–106)
CO2: 23 mmol/L (ref 18–29)
Calcium: 9.5 mg/dL (ref 8.7–10.3)
Creatinine, Ser: 1.43 mg/dL — ABNORMAL HIGH (ref 0.57–1.00)
GFR calc Af Amer: 39 mL/min/{1.73_m2} — ABNORMAL LOW (ref >59)
GFR calc non Af Amer: 34 mL/min/{1.73_m2} — ABNORMAL LOW (ref >59)
Glucose: 91 mg/dL (ref 65–99)
POTASSIUM: 3.7 mmol/L (ref 3.5–5.2)
Sodium: 142 mmol/L (ref 134–144)

## 2016-04-04 ENCOUNTER — Encounter: Payer: Self-pay | Admitting: Family Medicine

## 2016-09-13 ENCOUNTER — Encounter (INDEPENDENT_AMBULATORY_CARE_PROVIDER_SITE_OTHER): Payer: Self-pay

## 2016-10-02 ENCOUNTER — Encounter: Payer: Self-pay | Admitting: Family Medicine

## 2016-10-02 ENCOUNTER — Ambulatory Visit (INDEPENDENT_AMBULATORY_CARE_PROVIDER_SITE_OTHER): Payer: Commercial Managed Care - HMO | Admitting: Family Medicine

## 2016-10-02 VITALS — BP 109/69 | HR 71 | Temp 97.9°F | Ht 62.25 in | Wt 141.8 lb

## 2016-10-02 DIAGNOSIS — E039 Hypothyroidism, unspecified: Secondary | ICD-10-CM

## 2016-10-02 DIAGNOSIS — K219 Gastro-esophageal reflux disease without esophagitis: Secondary | ICD-10-CM

## 2016-10-02 DIAGNOSIS — E7800 Pure hypercholesterolemia, unspecified: Secondary | ICD-10-CM

## 2016-10-02 DIAGNOSIS — Z23 Encounter for immunization: Secondary | ICD-10-CM | POA: Diagnosis not present

## 2016-10-02 DIAGNOSIS — Z0000 Encounter for general adult medical examination without abnormal findings: Secondary | ICD-10-CM | POA: Diagnosis not present

## 2016-10-02 DIAGNOSIS — I10 Essential (primary) hypertension: Secondary | ICD-10-CM | POA: Diagnosis not present

## 2016-10-02 LAB — URINALYSIS, ROUTINE W REFLEX MICROSCOPIC
BILIRUBIN UA: NEGATIVE
Glucose, UA: NEGATIVE
KETONES UA: NEGATIVE
Nitrite, UA: NEGATIVE
PROTEIN UA: NEGATIVE
RBC UA: NEGATIVE
SPEC GRAV UA: 1.01 (ref 1.005–1.030)
Urobilinogen, Ur: 0.2 mg/dL (ref 0.2–1.0)
pH, UA: 5.5 (ref 5.0–7.5)

## 2016-10-02 LAB — MICROSCOPIC EXAMINATION

## 2016-10-02 MED ORDER — LEVOTHYROXINE SODIUM 50 MCG PO TABS: 50.0000 ug | ORAL_TABLET | Freq: Every day | ORAL | 4 refills | Status: DC

## 2016-10-02 MED ORDER — METOPROLOL SUCCINATE ER 50 MG PO TB24
50.0000 mg | ORAL_TABLET | Freq: Every day | ORAL | 4 refills | Status: DC
Start: 2016-10-02 — End: 2017-11-15

## 2016-10-02 MED ORDER — HYDROCHLOROTHIAZIDE 25 MG PO TABS: 25.0000 mg | ORAL_TABLET | Freq: Every day | ORAL | 4 refills | Status: DC

## 2016-10-02 MED ORDER — OMEPRAZOLE 20 MG PO CPDR: 20.0000 mg | DELAYED_RELEASE_CAPSULE | Freq: Every day | ORAL | 4 refills | Status: DC

## 2016-10-02 MED ORDER — ATORVASTATIN CALCIUM 10 MG PO TABS: 10.0000 mg | ORAL_TABLET | Freq: Every day | ORAL | 4 refills | Status: DC

## 2016-10-02 MED ORDER — BENAZEPRIL HCL 10 MG PO TABS: 10.0000 mg | ORAL_TABLET | Freq: Every day | ORAL | 4 refills | Status: DC

## 2016-10-02 NOTE — Assessment & Plan Note (Signed)
The current medical regimen is effective;  continue present plan and medications.  

## 2016-10-02 NOTE — Progress Notes (Signed)
BP 109/69 (BP Location: Left Arm, Patient Position: Sitting, Cuff Size: Normal)   Pulse 71   Temp 97.9 F (36.6 C)   Ht 5' 2.25" (1.581 m)   Wt 141 lb 12.8 oz (64.3 kg)   BMI 25.73 kg/m    Subjective:    Patient ID: Michelle Novak, female    DOB: [birth year], 82 y.o.   MRN: [id number]  HPI: Michelle Novak is a 82 y.o. female  Chief Complaint  Patient presents with  . Annual Exam  AWV metrics met  Also recheck blood pressure, cholesterol, hypothyroid and reflux. Patient doing well with medications no side effects and taking faithfully. Medications seem to be working well at home with no issues.  Relevant past medical, surgical, family and social history reviewed and updated as indicated. Interim medical history since our last visit reviewed. Allergies and medications reviewed and updated.  Review of Systems  Constitutional: Negative.   HENT: Negative.   Eyes: Negative.   Respiratory: Negative.   Cardiovascular: Negative.   Gastrointestinal: Negative.   Endocrine: Negative.   Genitourinary: Negative.   Musculoskeletal: Negative.   Skin: Negative.   Allergic/Immunologic: Negative.   Neurological: Negative.   Hematological: Negative.   Psychiatric/Behavioral: Negative.     Per HPI unless specifically indicated above     Objective:    BP 109/69 (BP Location: Left Arm, Patient Position: Sitting, Cuff Size: Normal)   Pulse 71   Temp 97.9 F (36.6 C)   Ht 5' 2.25" (1.581 m)   Wt 141 lb 12.8 oz (64.3 kg)   BMI 25.73 kg/m   Wt Readings from Last 3 Encounters:  10/02/16 141 lb 12.8 oz (64.3 kg)  03/30/16 139 lb (63 kg)  09/28/15 147 lb (66.7 kg)    Physical Exam  Constitutional: She is oriented to person, place, and time. She appears well-developed and well-nourished.  HENT:  Head: Normocephalic and atraumatic.  Right Ear: External ear normal.  Left Ear: External ear normal.  Nose: Nose normal.  Mouth/Throat: Oropharynx is clear and moist.  Eyes:  Conjunctivae and EOM are normal. Pupils are equal, round, and reactive to light.  Neck: Normal range of motion. Neck supple. Carotid bruit is not present.  Cardiovascular: Normal rate, regular rhythm and normal heart sounds.   No murmur heard. Pulmonary/Chest: Effort normal and breath sounds normal. She exhibits no mass. Right breast exhibits no mass, no skin change and no tenderness. Left breast exhibits no mass, no skin change and no tenderness. Breasts are symmetrical.  Abdominal: Soft. Bowel sounds are normal. There is no hepatosplenomegaly.  Musculoskeletal: Normal range of motion.  Neurological: She is alert and oriented to person, place, and time.  Skin: No rash noted.  Psychiatric: She has a normal mood and affect. Her behavior is normal. Judgment and thought content normal.    Results for orders placed or performed in visit on 48/18/56  Basic metabolic panel  Result Value Ref Range   Glucose 91 65 - 99 mg/dL   BUN 23 8 - 27 mg/dL   Creatinine, Ser 1.43 (H) 0.57 - 1.00 mg/dL   GFR calc non Af Amer 34 (L) >59 mL/min/1.73   GFR calc Af Amer 39 (L) >59 mL/min/1.73   BUN/Creatinine Ratio 16 12 - 28   Sodium 142 134 - 144 mmol/L   Potassium 3.7 3.5 - 5.2 mmol/L   Chloride 100 96 - 106 mmol/L   CO2 23 18 - 29 mmol/L   Calcium 9.5 8.7 - 10.3  mg/dL  LP+ALT+AST Piccolo, [employer]  Result Value Ref Range   ALT (SGPT) Piccolo, Waived 23 10 - 47 U/L   AST (SGOT) Piccolo, Waived 27 11 - 38 U/L   Cholesterol Piccolo, Waived 188 <200 mg/dL   HDL Chol Piccolo, Waived 61 >59 mg/dL   Triglycerides Piccolo,Waived 117 <150 mg/dL   Chol/HDL Ratio Piccolo,Waive 3.1 mg/dL   LDL Chol Calc Piccolo Waived 103 (H) <100 mg/dL   VLDL Chol Calc Piccolo,Waive 23 <30 mg/dL      Assessment & Plan:   Problem List Items Addressed This Visit      Cardiovascular and Mediastinum   Hypertension    The current medical regimen is effective;  continue present plan and medications.       Relevant  Medications   metoprolol succinate (TOPROL-XL) 50 MG 24 hr tablet   hydrochlorothiazide (HYDRODIURIL) 25 MG tablet   benazepril (LOTENSIN) 10 MG tablet   atorvastatin (LIPITOR) 10 MG tablet   Other Relevant Orders   Comprehensive metabolic panel   CBC with Differential/Platelet   Urinalysis, Routine w reflex microscopic (not at [facility])     Digestive   Esophageal reflux    The current medical regimen is effective;  continue present plan and medications.       Relevant Medications   omeprazole (PRILOSEC) 20 MG capsule   Other Relevant Orders   Comprehensive metabolic panel   CBC with Differential/Platelet   Urinalysis, Routine w reflex microscopic (not at [facility])     Endocrine   Hypothyroidism    The current medical regimen is effective;  continue present plan and medications.       Relevant Medications   metoprolol succinate (TOPROL-XL) 50 MG 24 hr tablet   levothyroxine (SYNTHROID, LEVOTHROID) 50 MCG tablet   Other Relevant Orders   Comprehensive metabolic panel   CBC with Differential/Platelet   TSH   Urinalysis, Routine w reflex microscopic (not at [facility])     Other   Hyperlipidemia    The current medical regimen is effective;  continue present plan and medications.       Relevant Medications   metoprolol succinate (TOPROL-XL) 50 MG 24 hr tablet   hydrochlorothiazide (HYDRODIURIL) 25 MG tablet   benazepril (LOTENSIN) 10 MG tablet   atorvastatin (LIPITOR) 10 MG tablet   Other Relevant Orders   Lipid panel    Other Visit Diagnoses    Need for influenza vaccination    -  Primary   Relevant Orders   Flu vaccine HIGH DOSE PF (Fluzone High dose) (Completed)   PE (physical exam), annual           Follow up plan: Return for BMP,  Lipids, ALT, AST.

## 2016-10-02 NOTE — Patient Instructions (Addendum)

## 2016-10-03 ENCOUNTER — Encounter: Payer: Self-pay | Admitting: Family Medicine

## 2016-10-03 LAB — CBC WITH DIFFERENTIAL/PLATELET
BASOS: 1 %
Basophils Absolute: 0 10*3/uL (ref 0.0–0.2)
EOS (ABSOLUTE): 0.1 10*3/uL (ref 0.0–0.4)
EOS: 2 %
HEMATOCRIT: 34.8 % (ref 34.0–46.6)
HEMOGLOBIN: 11.1 g/dL (ref 11.1–15.9)
Immature Grans (Abs): 0 10*3/uL (ref 0.0–0.1)
Immature Granulocytes: 0 %
LYMPHS ABS: 3.8 10*3/uL — AB (ref 0.7–3.1)
Lymphs: 47 %
MCH: 28.2 pg (ref 26.6–33.0)
MCHC: 31.9 g/dL (ref 31.5–35.7)
MCV: 88 fL (ref 79–97)
MONOCYTES: 6 %
MONOS ABS: 0.5 10*3/uL (ref 0.1–0.9)
Neutrophils Absolute: 3.6 10*3/uL (ref 1.4–7.0)
Neutrophils: 44 %
Platelets: 294 10*3/uL (ref 150–379)
RBC: 3.94 x10E6/uL (ref 3.77–5.28)
RDW: 14.7 % (ref 12.3–15.4)
WBC: 8.1 10*3/uL (ref 3.4–10.8)

## 2016-10-03 LAB — COMPREHENSIVE METABOLIC PANEL
A/G RATIO: 1.8 (ref 1.2–2.2)
ALT: 16 IU/L (ref 0–32)
AST: 15 IU/L (ref 0–40)
Albumin: 4.5 g/dL (ref 3.5–4.7)
Alkaline Phosphatase: 87 IU/L (ref 39–117)
BILIRUBIN TOTAL: 0.4 mg/dL (ref 0.0–1.2)
BUN/Creatinine Ratio: 14 (ref 12–28)
BUN: 21 mg/dL (ref 8–27)
CALCIUM: 9.7 mg/dL (ref 8.7–10.3)
CHLORIDE: 103 mmol/L (ref 96–106)
CO2: 25 mmol/L (ref 18–29)
Creatinine, Ser: 1.49 mg/dL — ABNORMAL HIGH (ref 0.57–1.00)
GFR, EST AFRICAN AMERICAN: 37 mL/min/{1.73_m2} — AB (ref >59)
GFR, EST NON AFRICAN AMERICAN: 32 mL/min/{1.73_m2} — AB (ref >59)
GLUCOSE: 94 mg/dL (ref 65–99)
Globulin, Total: 2.5 g/dL (ref 1.5–4.5)
POTASSIUM: 3.7 mmol/L (ref 3.5–5.2)
Sodium: 145 mmol/L — ABNORMAL HIGH (ref 134–144)
TOTAL PROTEIN: 7 g/dL (ref 6.0–8.5)

## 2016-10-03 LAB — LIPID PANEL
Chol/HDL Ratio: 3.4 ratio units (ref 0.0–4.4)
Cholesterol, Total: 190 mg/dL (ref 100–199)
HDL: 56 mg/dL (ref >39)
LDL Calculated: 114 mg/dL — ABNORMAL HIGH (ref 0–99)
TRIGLYCERIDES: 102 mg/dL (ref 0–149)
VLDL CHOLESTEROL CAL: 20 mg/dL (ref 5–40)

## 2016-10-03 LAB — TSH: TSH: 0.839 u[IU]/mL (ref 0.450–4.500)

## 2016-11-06 IMAGING — CR DG CHEST 2V
1 series · 2 of 2 positions shown · non-contrast
Comparison: 05/08/2007

CLINICAL DATA: Initial encounter for discomfort and tightness under
left breast for 3 days.

EXAM:
CHEST  2 VIEW

[Series 1: w chest pa · 0.14mm/px · 2 of 2 slices shown]
[im 1/2]
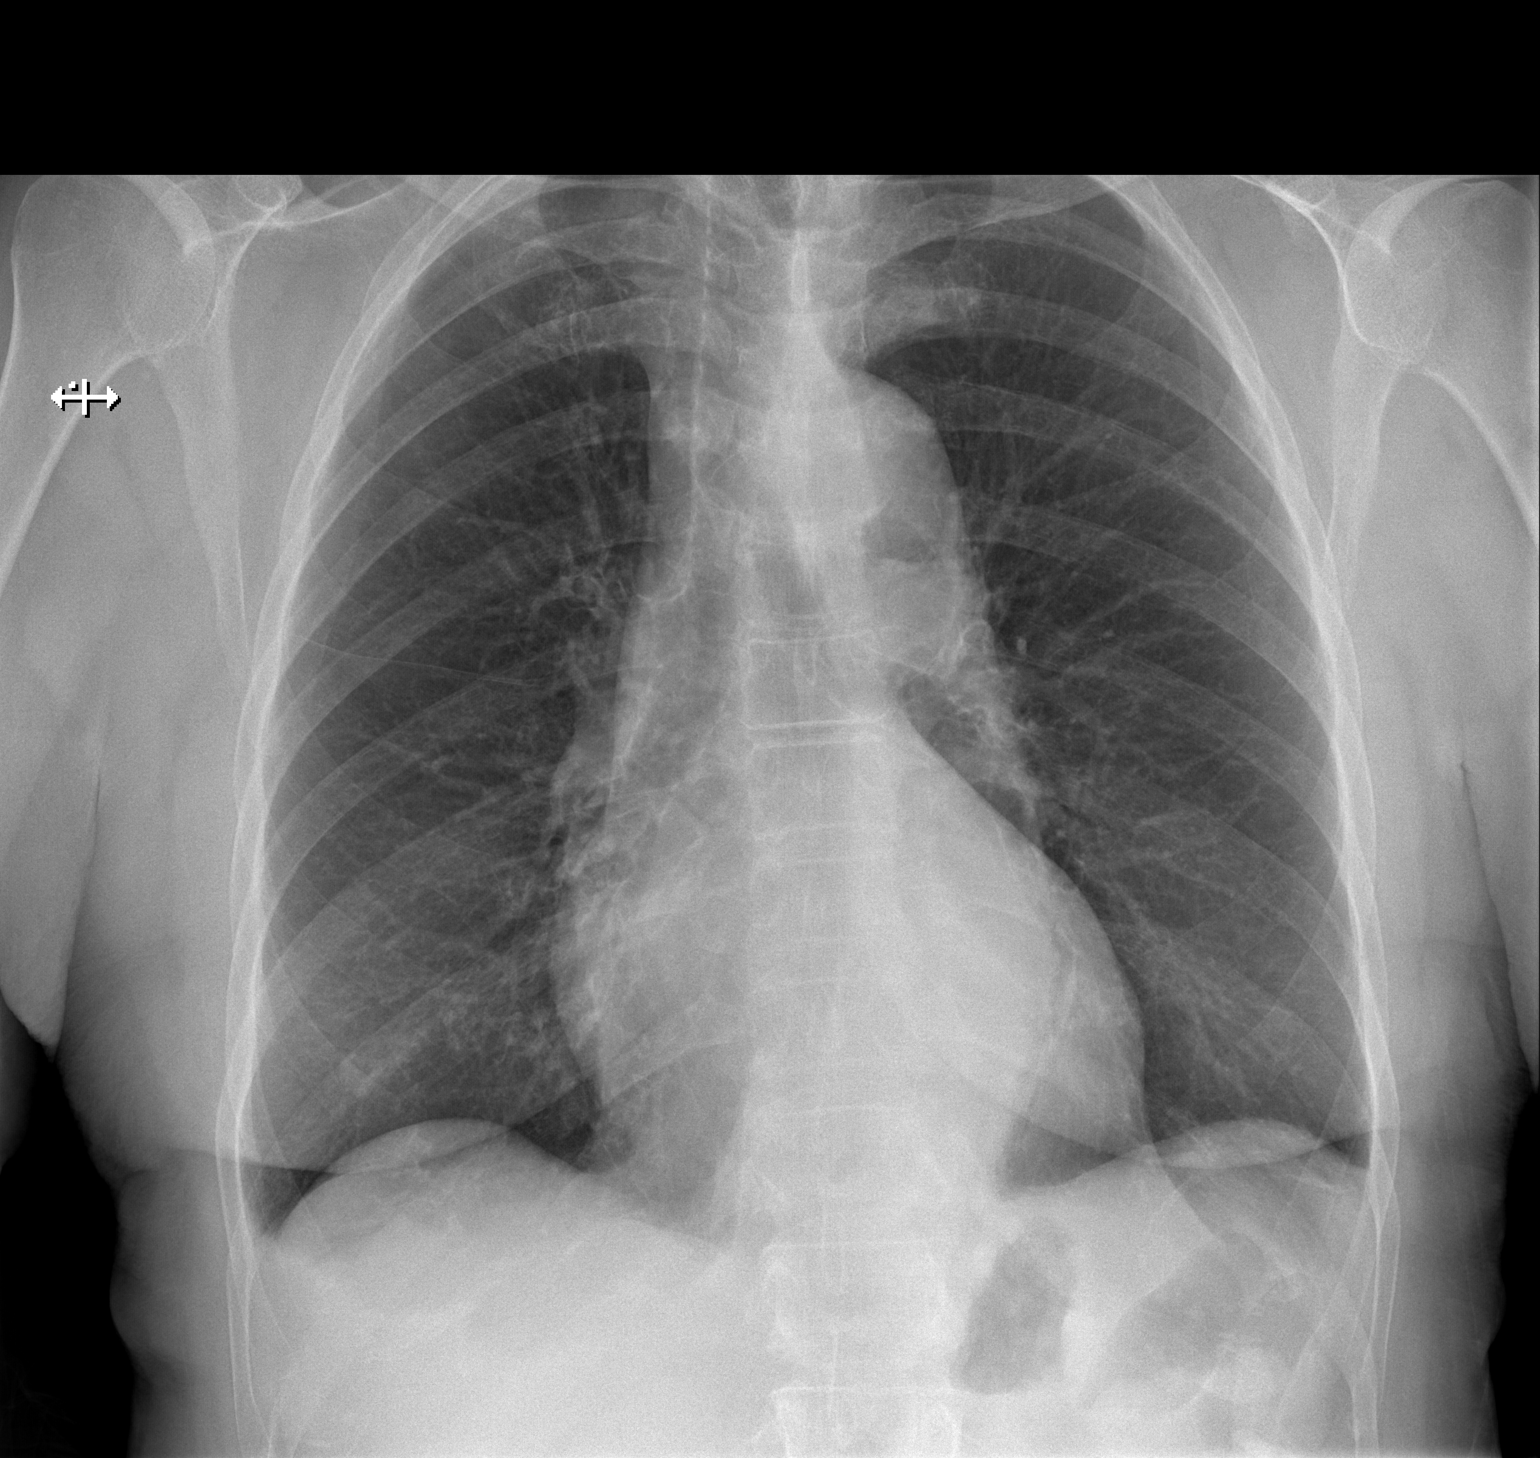
[im 2/2]
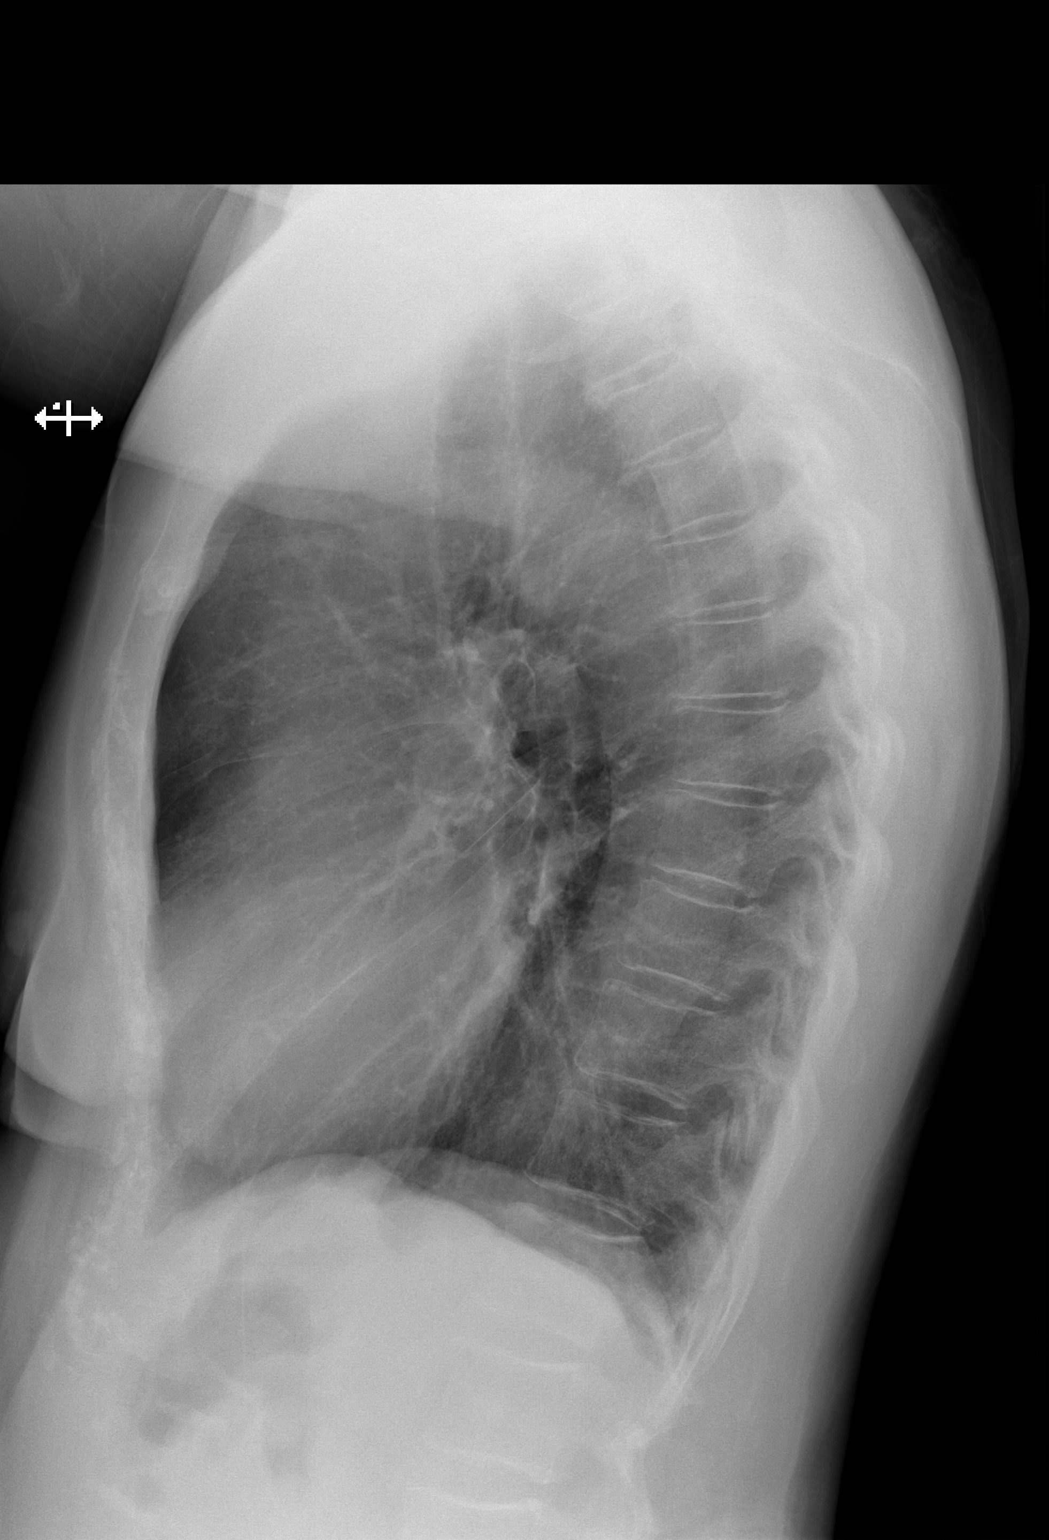

[2 of 2 positions shown; findings below may reference images not displayed]

FINDINGS: The heart size and mediastinal contours are within normal limits.
Both lungs are clear. The visualized skeletal structures are
unremarkable.
IMPRESSION: No active cardiopulmonary disease.

## 2017-01-04 DIAGNOSIS — H524 Presbyopia: Secondary | ICD-10-CM | POA: Diagnosis not present

## 2017-04-03 ENCOUNTER — Encounter: Payer: Self-pay | Admitting: Family Medicine

## 2017-04-03 ENCOUNTER — Ambulatory Visit (INDEPENDENT_AMBULATORY_CARE_PROVIDER_SITE_OTHER): Payer: Medicare HMO | Admitting: Family Medicine

## 2017-04-03 VITALS — BP 114/75 | HR 60 | Wt 146.6 lb

## 2017-04-03 DIAGNOSIS — K219 Gastro-esophageal reflux disease without esophagitis: Secondary | ICD-10-CM | POA: Diagnosis not present

## 2017-04-03 DIAGNOSIS — I10 Essential (primary) hypertension: Secondary | ICD-10-CM | POA: Diagnosis not present

## 2017-04-03 DIAGNOSIS — E7800 Pure hypercholesterolemia, unspecified: Secondary | ICD-10-CM

## 2017-04-03 DIAGNOSIS — E039 Hypothyroidism, unspecified: Secondary | ICD-10-CM

## 2017-04-03 LAB — LP+ALT+AST PICCOLO, WAIVED
ALT (SGPT) Piccolo, Waived: 15 U/L (ref 10–47)
AST (SGOT) PICCOLO, WAIVED: 27 U/L (ref 11–38)
CHOL/HDL RATIO PICCOLO,WAIVE: 2.9 mg/dL
Cholesterol Piccolo, Waived: 193 mg/dL (ref <200)
HDL Chol Piccolo, Waived: 127 mg/dL (ref >59)
LDL CHOL CALC PICCOLO WAIVED: 94 mg/dL (ref <100)
Triglycerides Piccolo,Waived: 165 mg/dL — ABNORMAL HIGH (ref <150)
VLDL Chol Calc Piccolo,Waive: 33 mg/dL — ABNORMAL HIGH (ref <30)

## 2017-04-03 NOTE — Progress Notes (Signed)
BP 114/75   Pulse 60   Wt 146 lb 9.6 oz (66.5 kg)   SpO2 99%   BMI 26.60 kg/m    Subjective:    Patient ID: Michelle Novak, female    DOB: 02/03/1932, 83 y.o.   MRN: [id number]  HPI: Michelle Novak is a 83 y.o. female  Chief Complaint  Patient presents with  . Follow-up  . Hyperlipidemia  . Hypertension   Patient follow-up medicine check no complaints from medications working well without problems no issues with blood pressure cholesterol reflux. Reviewed medication dosing and no side effects. No thyroid signs or symptoms taking medicines faithfully.  Relevant past medical, surgical, family and social history reviewed and updated as indicated. Interim medical history since our last visit reviewed. Allergies and medications reviewed and updated.  Review of Systems  Constitutional: Negative.   Respiratory: Negative.   Cardiovascular: Negative.     Per HPI unless specifically indicated above     Objective:    BP 114/75   Pulse 60   Wt 146 lb 9.6 oz (66.5 kg)   SpO2 99%   BMI 26.60 kg/m   Wt Readings from Last 3 Encounters:  04/03/17 146 lb 9.6 oz (66.5 kg)  10/02/16 141 lb 12.8 oz (64.3 kg)  03/30/16 139 lb (63 kg)    Physical Exam  Constitutional: She is oriented to person, place, and time. She appears well-developed and well-nourished.  HENT:  Head: Normocephalic and atraumatic.  Eyes: Conjunctivae and EOM are normal.  Neck: Normal range of motion.  Cardiovascular: Normal rate, regular rhythm and normal heart sounds.   Pulmonary/Chest: Effort normal and breath sounds normal.  Musculoskeletal: Normal range of motion.  Neurological: She is alert and oriented to person, place, and time.  Skin: No erythema.  Psychiatric: She has a normal mood and affect. Her behavior is normal. Judgment and thought content normal.    Results for orders placed or performed in visit on 10/02/16  Microscopic Examination  Result Value Ref Range   WBC, UA 6-10 (A)  0 - 5 /hpf   Epithelial Cells (non renal) 0-10 0 - 10 /hpf   Bacteria, UA Moderate (A) None seen/Few  Comprehensive metabolic panel  Result Value Ref Range   Glucose 94 65 - 99 mg/dL   BUN 21 8 - 27 mg/dL   Creatinine, Ser 1.49 (H) 0.57 - 1.00 mg/dL   GFR calc non Af Amer 32 (L) >59 mL/min/1.73   GFR calc Af Amer 37 (L) >59 mL/min/1.73   BUN/Creatinine Ratio 14 12 - 28   Sodium 145 (H) 134 - 144 mmol/L   Potassium 3.7 3.5 - 5.2 mmol/L   Chloride 103 96 - 106 mmol/L   CO2 25 18 - 29 mmol/L   Calcium 9.7 8.7 - 10.3 mg/dL   Total Protein 7.0 6.0 - 8.5 g/dL   Albumin 4.5 3.5 - 4.7 g/dL   Globulin, Total 2.5 1.5 - 4.5 g/dL   Albumin/Globulin Ratio 1.8 1.2 - 2.2   Bilirubin Total 0.4 0.0 - 1.2 mg/dL   Alkaline Phosphatase 87 39 - 117 IU/L   AST 15 0 - 40 IU/L   ALT 16 0 - 32 IU/L  Lipid panel  Result Value Ref Range   Cholesterol, Total 190 100 - 199 mg/dL   Triglycerides 102 0 - 149 mg/dL   HDL 56 >39 mg/dL   VLDL Cholesterol Cal 20 5 - 40 mg/dL   LDL Calculated 114 (H) 0 - 99 mg/dL  Chol/HDL Ratio 3.4 0.0 - 4.4 ratio units  CBC with Differential/Platelet  Result Value Ref Range   WBC 8.1 3.4 - 10.8 x10E3/uL   RBC 3.94 3.77 - 5.28 x10E6/uL   Hemoglobin 11.1 11.1 - 15.9 g/dL   Hematocrit 34.8 34.0 - 46.6 %   MCV 88 79 - 97 fL   MCH 28.2 26.6 - 33.0 pg   MCHC 31.9 31.5 - 35.7 g/dL   RDW 14.7 12.3 - 15.4 %   Platelets 294 150 - 379 x10E3/uL   Neutrophils 44 Not Estab. %   Lymphs 47 Not Estab. %   Monocytes 6 Not Estab. %   Eos 2 Not Estab. %   Basos 1 Not Estab. %   Neutrophils Absolute 3.6 1.4 - 7.0 x10E3/uL   Lymphocytes Absolute 3.8 (H) 0.7 - 3.1 x10E3/uL   Monocytes Absolute 0.5 0.1 - 0.9 x10E3/uL   EOS (ABSOLUTE) 0.1 0.0 - 0.4 x10E3/uL   Basophils Absolute 0.0 0.0 - 0.2 x10E3/uL   Immature Granulocytes 0 Not Estab. %   Immature Grans (Abs) 0.0 0.0 - 0.1 x10E3/uL  TSH  Result Value Ref Range   TSH 0.839 0.450 - 4.500 uIU/mL  Urinalysis, Routine w reflex  microscopic (not at [facility])  Result Value Ref Range   Specific Gravity, UA 1.010 1.005 - 1.030   pH, UA 5.5 5.0 - 7.5   Color, UA Yellow Yellow   Appearance Ur Hazy (A) Clear   Leukocytes, UA 1+ (A) Negative   Protein, UA Negative Negative/Trace   Glucose, UA Negative Negative   Ketones, UA Negative Negative   RBC, UA Negative Negative   Bilirubin, UA Negative Negative   Urobilinogen, Ur 0.2 0.2 - 1.0 mg/dL   Nitrite, UA Negative Negative   Microscopic Examination See below:       Assessment & Plan:   Problem List Items Addressed This Visit      Cardiovascular and Mediastinum   Hypertension - Primary    The current medical regimen is effective;  continue present plan and medications.       Relevant Orders   Basic metabolic panel   LP+ALT+AST Piccolo, [place]     Digestive   Esophageal reflux    The current medical regimen is effective;  continue present plan and medications.         Endocrine   Hypothyroidism    The current medical regimen is effective;  continue present plan and medications.         Other   Hyperlipidemia    The current medical regimen is effective;  continue present plan and medications.       Relevant Orders   Basic metabolic panel   LP+ALT+AST Piccolo, Waived       Follow up plan: Return in about 6 months (around 10/04/2017) for Physical Exam.

## 2017-04-03 NOTE — Assessment & Plan Note (Signed)
The current medical regimen is effective;  continue present plan and medications.  

## 2017-04-04 ENCOUNTER — Encounter: Payer: Self-pay | Admitting: Family Medicine

## 2017-04-04 LAB — BASIC METABOLIC PANEL
BUN / CREAT RATIO: 15 (ref 12–28)
BUN: 22 mg/dL (ref 8–27)
CO2: 25 mmol/L (ref 18–29)
Calcium: 9.9 mg/dL (ref 8.7–10.3)
Chloride: 102 mmol/L (ref 96–106)
Creatinine, Ser: 1.48 mg/dL — ABNORMAL HIGH (ref 0.57–1.00)
GFR calc Af Amer: 37 mL/min/{1.73_m2} — ABNORMAL LOW (ref >59)
GFR, EST NON AFRICAN AMERICAN: 33 mL/min/{1.73_m2} — AB (ref >59)
Glucose: 93 mg/dL (ref 65–99)
POTASSIUM: 3.9 mmol/L (ref 3.5–5.2)
SODIUM: 141 mmol/L (ref 134–144)

## 2017-10-03 ENCOUNTER — Ambulatory Visit (INDEPENDENT_AMBULATORY_CARE_PROVIDER_SITE_OTHER): Payer: Medicare HMO

## 2017-10-03 VITALS — BP 127/76 | HR 74 | Temp 98.1°F | Resp 16 | Ht 62.0 in | Wt 144.8 lb

## 2017-10-03 DIAGNOSIS — Z0000 Encounter for general adult medical examination without abnormal findings: Secondary | ICD-10-CM

## 2017-10-03 NOTE — Patient Instructions (Addendum)
Michelle Novak , Thank you for taking time to come for your Medicare Wellness Visit. I appreciate your ongoing commitment to your health goals. Please review the following plan we discussed and let me know if I can assist you in the future.   Screening recommendations/referrals: Colonoscopy: no longer required Mammogram: no longer required Bone Density: completed 10/21/2014 Recommended yearly ophthalmology/optometry visit for glaucoma screening and checkup Recommended yearly dental visit for hygiene and checkup  Vaccinations: Influenza vaccine: up to date  Pneumococcal vaccine: up to date Tdap vaccine: up to date Shingles vaccine: up to date  Advanced directives: Please bring a copy of your health care power of attorney and living will to the office at your convenience.  Conditions/risks identified: Recommend drinking at least 2-3 bottles of water a day   Next appointment: Follow up on 10/16/2017 at 9:00am with [place]. Follow up in one year for your annual wellness exam.    Preventive Care 65 Years and Older, Female Preventive care refers to lifestyle choices and visits with your health care provider that can promote health and wellness. What does preventive care include?  A yearly physical exam. This is also called an annual well check.  Dental exams once or twice a year.  Routine eye exams. Ask your health care provider how often you should have your eyes checked.  Personal lifestyle choices, including:  Daily care of your teeth and gums.  Regular physical activity.  Eating a healthy diet.  Avoiding tobacco and drug use.  Limiting alcohol use.  Practicing safe sex.  Taking low-dose aspirin every day.  Taking vitamin and mineral supplements as recommended by your health care provider. What happens during an annual well check? The services and screenings done by your health care provider during your annual well check will depend on your age, overall health,  lifestyle risk factors, and family history of disease. Counseling  Your health care provider may ask you questions about your:  Alcohol use.  Tobacco use.  Drug use.  Emotional well-being.  Home and relationship well-being.  Sexual activity.  Eating habits.  History of falls.  Memory and ability to understand (cognition).  Work and work Statistician.  Reproductive health. Screening  You may have the following tests or measurements:  Height, weight, and BMI.  Blood pressure.  Lipid and cholesterol levels. These may be checked every 5 years, or more frequently if you are over 77 years old.  Skin check.  Lung cancer screening. You may have this screening every year starting at age 16 if you have a 30-pack-year history of smoking and currently smoke or have quit within the past 15 years.  Fecal occult blood test (FOBT) of the stool. You may have this test every year starting at age 29.  Flexible sigmoidoscopy or colonoscopy. You may have a sigmoidoscopy every 5 years or a colonoscopy every 10 years starting at age 13.  Hepatitis C blood test.  Hepatitis B blood test.  Sexually transmitted disease (STD) testing.  Diabetes screening. This is done by checking your blood sugar (glucose) after you have not eaten for a while (fasting). You may have this done every 1-3 years.  Bone density scan. This is done to screen for osteoporosis. You may have this done starting at age 38.  Mammogram. This may be done every 1-2 years. Talk to your health care provider about how often you should have regular mammograms. Talk with your health care provider about your test results, treatment options, and if necessary, the  need for more tests. Vaccines  Your health care provider may recommend certain vaccines, such as:  Influenza vaccine. This is recommended every year.  Tetanus, diphtheria, and acellular pertussis (Tdap, Td) vaccine. You may need a Td booster every 10 years.  Zoster  vaccine. You may need this after age 68.  Pneumococcal 13-valent conjugate (PCV13) vaccine. One dose is recommended after age 39.  Pneumococcal polysaccharide (PPSV23) vaccine. One dose is recommended after age 74. Talk to your health care provider about which screenings and vaccines you need and how often you need them. This information is not intended to replace advice given to you by your health care provider. Make sure you discuss any questions you have with your health care provider. Document Released: 11/19/2015 Document Revised: 07/12/2016 Document Reviewed: 08/24/2015 Elsevier Interactive Patient Education  2017 [place] Prevention in the Home Falls can cause injuries. They can happen to people of all ages. There are many things you can do to make your home safe and to help prevent falls. What can I do on the outside of my home?  Regularly fix the edges of walkways and driveways and fix any cracks.  Remove anything that might make you trip as you walk through a door, such as a raised step or threshold.  Trim any bushes or trees on the path to your home.  Use bright outdoor lighting.  Clear any walking paths of anything that might make someone trip, such as rocks or tools.  Regularly check to see if handrails are loose or broken. Make sure that both sides of any steps have handrails.  Any raised decks and porches should have guardrails on the edges.  Have any leaves, snow, or ice cleared regularly.  Use sand or salt on walking paths during winter.  Clean up any spills in your garage right away. This includes oil or grease spills. What can I do in the bathroom?  Use night lights.  Install grab bars by the toilet and in the tub and shower. Do not use towel bars as grab bars.  Use non-skid mats or decals in the tub or shower.  If you need to sit down in the shower, use a plastic, non-slip stool.  Keep the floor dry. Clean up any water that spills on the  floor as soon as it happens.  Remove soap buildup in the tub or shower regularly.  Attach bath mats securely with double-sided non-slip rug tape.  Do not have throw rugs and other things on the floor that can make you trip. What can I do in the bedroom?  Use night lights.  Make sure that you have a light by your bed that is easy to reach.  Do not use any sheets or blankets that are too big for your bed. They should not hang down onto the floor.  Have a firm chair that has side arms. You can use this for support while you get dressed.  Do not have throw rugs and other things on the floor that can make you trip. What can I do in the kitchen?  Clean up any spills right away.  Avoid walking on wet floors.  Keep items that you use a lot in easy-to-reach places.  If you need to reach something above you, use a strong step stool that has a grab bar.  Keep electrical cords out of the way.  Do not use floor polish or wax that makes floors slippery. If you must use wax,  use non-skid floor wax.  Do not have throw rugs and other things on the floor that can make you trip. What can I do with my stairs?  Do not leave any items on the stairs.  Make sure that there are handrails on both sides of the stairs and use them. Fix handrails that are broken or loose. Make sure that handrails are as long as the stairways.  Check any carpeting to make sure that it is firmly attached to the stairs. Fix any carpet that is loose or worn.  Avoid having throw rugs at the top or bottom of the stairs. If you do have throw rugs, attach them to the floor with carpet tape.  Make sure that you have a light switch at the top of the stairs and the bottom of the stairs. If you do not have them, ask someone to add them for you. What else can I do to help prevent falls?  Wear shoes that:  Do not have high heels.  Have rubber bottoms.  Are comfortable and fit you well.  Are closed at the toe. Do not wear  sandals.  If you use a stepladder:  Make sure that it is fully opened. Do not climb a closed stepladder.  Make sure that both sides of the stepladder are locked into place.  Ask someone to hold it for you, if possible.  Clearly mark and make sure that you can see:  Any grab bars or handrails.  First and last steps.  Where the edge of each step is.  Use tools that help you move around (mobility aids) if they are needed. These include:  Canes.  Walkers.  Scooters.  Crutches.  Turn on the lights when you go into a dark area. Replace any light bulbs as soon as they burn out.  Set up your furniture so you have a clear path. Avoid moving your furniture around.  If any of your floors are uneven, fix them.  If there are any pets around you, be aware of where they are.  Review your medicines with your doctor. Some medicines can make you feel dizzy. This can increase your chance of falling. Ask your doctor what other things that you can do to help prevent falls. This information is not intended to replace advice given to you by your health care provider. Make sure you discuss any questions you have with your health care provider. Document Released: 08/19/2009 Document Revised: 03/30/2016 Document Reviewed: 11/27/2014 Elsevier Interactive Patient Education  2017 [employer].

## 2017-10-03 NOTE — Progress Notes (Signed)
Subjective:   Michelle Novak is a 83 y.o. female who presents for Medicare Annual (Subsequent) preventive examination.  Review of Systems:   Cardiac Risk Factors include: hypertension;dyslipidemia;advanced age (>35men, >43 women)     Objective:     Vitals: BP 127/76 (BP Location: Left Arm, Patient Position: Sitting)   Pulse 74   Temp 98.1 F (36.7 C) (Oral)   Resp 16   Ht 5\' 2"  (1.575 m)   Wt 144 lb 12.8 oz (65.7 kg)   BMI 26.48 kg/m   Body mass index is 26.48 kg/m.   Tobacco Social History   Tobacco Use  Smoking Status Never Smoker  Smokeless Tobacco Never Used     Counseling given: Not Answered   Past Medical History:  Diagnosis Date  . Arthritis   . Chronic kidney disease   . Esophageal reflux   . Heart murmur   . History of abnormal cervical Pap smear   . Hyperlipidemia   . Hypertension   . Hypothyroidism   . Osteoporosis    Past Surgical History:  Procedure Laterality Date  . ABDOMINAL HYSTERECTOMY    . JOINT REPLACEMENT     Family History  Problem Relation Age of Onset  . Heart disease Mother   . Heart attack Mother   . Cancer Father        bone  . Heart disease Sister    Social History   Substance and Sexual Activity  Sexual Activity Not on file    Outpatient Encounter Medications as of 10/03/2017  Medication Sig  . aspirin EC 81 MG tablet Take 81 mg by mouth daily.  Marland Kitchen atorvastatin (LIPITOR) 10 MG tablet Take 1 tablet (10 mg total) by mouth at bedtime.  . benazepril (LOTENSIN) 10 MG tablet Take 1 tablet (10 mg total) by mouth daily.  . hydrochlorothiazide (HYDRODIURIL) 25 MG tablet Take 1 tablet (25 mg total) by mouth daily.  Marland Kitchen levothyroxine (SYNTHROID, LEVOTHROID) 50 MCG tablet Take 1 tablet (50 mcg total) by mouth daily before breakfast.  . metoprolol succinate (TOPROL-XL) 50 MG 24 hr tablet Take 1 tablet (50 mg total) by mouth daily. Take with or immediately following a meal.  . omeprazole (PRILOSEC) 20 MG capsule Take 1  capsule (20 mg total) by mouth daily.   No facility-administered encounter medications on file as of 10/03/2017.     Activities of Daily Living In your present state of health, do you have any difficulty performing the following activities: 10/03/2017  Hearing? N  Vision? N  Difficulty concentrating or making decisions? Y  Walking or climbing stairs? N  Dressing or bathing? N  Doing errands, shopping? N  Preparing Food and eating ? N  Using the Toilet? N  In the past six months, have you accidently leaked urine? N  Do you have problems with loss of bowel control? N  Managing your Medications? N  Managing your Finances? N  Housekeeping or managing your Housekeeping? N  Some recent data might be hidden    Patient Care Team: Guadalupe Maple, MD as PCP - General (Family Medicine)    Assessment:    Exercise Activities and Dietary recommendations Current Exercise Habits: The patient does not participate in regular exercise at present, Exercise limited by: None identified  Goals    . DIET - INCREASE WATER INTAKE     Recommend drinking at least 2-3 bottles of water a day       Fall Risk Fall Risk  10/03/2017 04/03/2017 10/02/2016  09/28/2015  Falls in the past year? No No No No   Depression Screen PHQ 2/9 Scores 10/03/2017 04/03/2017 10/02/2016 09/28/2015  PHQ - 2 Score 0 0 0 0  PHQ- 9 Score 1 - - -     Cognitive Function     6CIT Screen 10/03/2017  What Year? 0 points  What month? 0 points  What time? 0 points  Count back from 20 0 points  Months in reverse 0 points  Repeat phrase 2 points  Total Score 2    Immunization History  Administered Date(s) Administered  . Influenza, High Dose Seasonal PF 10/02/2016  . Influenza-Unspecified 08/21/2013, 08/17/2015, 08/28/2017  . Pneumococcal Conjugate-13 09/23/2014  . Pneumococcal Polysaccharide-23 06/28/2005  . Tdap 07/30/2009  . Zoster 06/14/2006   Screening Tests Health Maintenance  Topic Date Due  .  TETANUS/TDAP  07/31/2019  . INFLUENZA VACCINE  Completed  . DEXA SCAN  Completed  . PNA vac Low Risk Adult  Completed      Plan:     I have personally reviewed and addressed the Medicare Annual Wellness questionnaire and have noted the following in the patient's chart:  A. Medical and social history B. Use of alcohol, tobacco or illicit drugs  C. Current medications and supplements D. Functional ability and status E.  Nutritional status F.  Physical activity G. Advance directives H. List of other physicians I.  Hospitalizations, surgeries, and ER visits in previous 12 months J.  [place] such as hearing and vision if needed, cognitive and depression L. Referrals and appointments   In addition, I have reviewed and discussed with patient certain preventive protocols, quality metrics, and best practice recommendations. A written personalized care plan for preventive services as well as general preventive health recommendations were provided to patient.   Signed,  Tyler Aas, LPN Nurse Health Advisor   Nurse Notes: Patient noticed discoloration to skin on both cheeks, started about 6 months ago. No itching or burning and it has not spread to any areas of her body. She was advised to call if it worsens before her scheduled appointment on 10/16/17.

## 2017-10-16 ENCOUNTER — Encounter: Payer: Medicare HMO | Admitting: Family Medicine

## 2017-11-15 ENCOUNTER — Encounter: Payer: Self-pay | Admitting: Family Medicine

## 2017-11-15 ENCOUNTER — Ambulatory Visit: Payer: Medicare HMO | Admitting: Family Medicine

## 2017-11-15 VITALS — BP 120/68 | HR 74 | Ht 62.99 in | Wt 144.0 lb

## 2017-11-15 DIAGNOSIS — N183 Chronic kidney disease, stage 3 unspecified: Secondary | ICD-10-CM

## 2017-11-15 DIAGNOSIS — I129 Hypertensive chronic kidney disease with stage 1 through stage 4 chronic kidney disease, or unspecified chronic kidney disease: Secondary | ICD-10-CM | POA: Diagnosis not present

## 2017-11-15 DIAGNOSIS — E039 Hypothyroidism, unspecified: Secondary | ICD-10-CM | POA: Diagnosis not present

## 2017-11-15 DIAGNOSIS — L821 Other seborrheic keratosis: Secondary | ICD-10-CM

## 2017-11-15 DIAGNOSIS — I10 Essential (primary) hypertension: Secondary | ICD-10-CM | POA: Diagnosis not present

## 2017-11-15 DIAGNOSIS — K219 Gastro-esophageal reflux disease without esophagitis: Secondary | ICD-10-CM | POA: Diagnosis not present

## 2017-11-15 DIAGNOSIS — Z0001 Encounter for general adult medical examination with abnormal findings: Secondary | ICD-10-CM | POA: Diagnosis not present

## 2017-11-15 DIAGNOSIS — Z0000 Encounter for general adult medical examination without abnormal findings: Secondary | ICD-10-CM

## 2017-11-15 DIAGNOSIS — Z7189 Other specified counseling: Secondary | ICD-10-CM | POA: Insufficient documentation

## 2017-11-15 DIAGNOSIS — E7800 Pure hypercholesterolemia, unspecified: Secondary | ICD-10-CM | POA: Diagnosis not present

## 2017-11-15 LAB — URINALYSIS, ROUTINE W REFLEX MICROSCOPIC
Bilirubin, UA: NEGATIVE
Glucose, UA: NEGATIVE
Ketones, UA: NEGATIVE
Nitrite, UA: NEGATIVE
PH UA: 6 (ref 5.0–7.5)
PROTEIN UA: NEGATIVE
Specific Gravity, UA: 1.01 (ref 1.005–1.030)
UUROB: 0.2 mg/dL (ref 0.2–1.0)

## 2017-11-15 LAB — MICROSCOPIC EXAMINATION

## 2017-11-15 MED ORDER — BENAZEPRIL HCL 10 MG PO TABS: 10.0000 mg | ORAL_TABLET | Freq: Every day | ORAL | 4 refills | Status: DC

## 2017-11-15 MED ORDER — OMEPRAZOLE 20 MG PO CPDR: 20.0000 mg | DELAYED_RELEASE_CAPSULE | Freq: Every day | ORAL | 4 refills | Status: DC

## 2017-11-15 MED ORDER — LEVOTHYROXINE SODIUM 50 MCG PO TABS: 50.0000 ug | ORAL_TABLET | Freq: Every day | ORAL | 4 refills | Status: DC

## 2017-11-15 MED ORDER — HYDROCHLOROTHIAZIDE 25 MG PO TABS: 25.0000 mg | ORAL_TABLET | Freq: Every day | ORAL | 4 refills | Status: DC

## 2017-11-15 MED ORDER — METOPROLOL SUCCINATE ER 50 MG PO TB24: 50.0000 mg | ORAL_TABLET | Freq: Every day | ORAL | 4 refills | Status: DC

## 2017-11-15 MED ORDER — ATORVASTATIN CALCIUM 10 MG PO TABS: 10.0000 mg | ORAL_TABLET | Freq: Every day | ORAL | 4 refills | Status: DC

## 2017-11-15 NOTE — Assessment & Plan Note (Signed)
The current medical regimen is effective;  continue present plan and medications.  

## 2017-11-15 NOTE — Assessment & Plan Note (Signed)
A voluntary discussion about advance care planning including the explanation and discussion of advance directives was extensively discussed  with the patient.  Explanation about the health care proxy and Living will was reviewed and packet with forms with explanation of how to fill them out was given.    

## 2017-11-15 NOTE — Progress Notes (Signed)
BP 120/68 (BP Location: Left Arm)   Pulse 74   Ht 5' 2.99" (1.6 m)   Wt 144 lb (65.3 kg)   SpO2 97%   BMI 25.51 kg/m    Subjective:    Patient ID: Michelle Novak, female    DOB: 1931-11-23, 84 y.o.   MRN: [id number]  HPI: Michelle Novak is a 84 y.o. female  Chief Complaint  Patient presents with  . Annual Exam  patient doing well with medications for blood pressure cholesterol thyroid and reflux. No issues with medications and taking faithfully without problems.  Relevant past medical, surgical, family and social history reviewed and updated as indicated. Interim medical history since our last visit reviewed. Allergies and medications reviewed and updated.  Review of Systems  Constitutional: Negative.   HENT: Negative.   Eyes: Negative.   Respiratory: Negative.   Cardiovascular: Negative.   Gastrointestinal: Negative.   Endocrine: Negative.   Genitourinary: Negative.   Musculoskeletal: Negative.   Skin: Negative.   Allergic/Immunologic: Negative.   Neurological: Negative.   Hematological: Negative.   Psychiatric/Behavioral: Negative.     Per HPI unless specifically indicated above     Objective:    BP 120/68 (BP Location: Left Arm)   Pulse 74   Ht 5' 2.99" (1.6 m)   Wt 144 lb (65.3 kg)   SpO2 97%   BMI 25.51 kg/m   Wt Readings from Last 3 Encounters:  11/15/17 144 lb (65.3 kg)  10/03/17 144 lb 12.8 oz (65.7 kg)  04/03/17 146 lb 9.6 oz (66.5 kg)    Physical Exam  Constitutional: She is oriented to person, place, and time. She appears well-developed and well-nourished.  HENT:  Head: Normocephalic and atraumatic.  Right Ear: External ear normal.  Left Ear: External ear normal.  Nose: Nose normal.  Mouth/Throat: Oropharynx is clear and moist.  Eyes: Conjunctivae and EOM are normal. Pupils are equal, round, and reactive to light.  Neck: Normal range of motion. Neck supple. Carotid bruit is not present.  Cardiovascular: Normal rate, regular  rhythm and normal heart sounds.  No murmur heard. Pulmonary/Chest: Effort normal and breath sounds normal. She exhibits no mass. Right breast exhibits no mass, no skin change and no tenderness. Left breast exhibits no mass, no skin change and no tenderness. Breasts are symmetrical.  Abdominal: Soft. Bowel sounds are normal. There is no hepatosplenomegaly.  Musculoskeletal: Normal range of motion.  Neurological: She is alert and oriented to person, place, and time.  Skin: No rash noted.  Has multiple seborrheic keratosis with skin changes on cheeks requesting further evaluation will refer to dermatology  Psychiatric: She has a normal mood and affect. Her behavior is normal. Judgment and thought content normal.    Results for orders placed or performed in visit on 16/07/37  Basic metabolic panel  Result Value Ref Range   Glucose 93 65 - 99 mg/dL   BUN 22 8 - 27 mg/dL   Creatinine, Ser 1.48 (H) 0.57 - 1.00 mg/dL   GFR calc non Af Amer 33 (L) >59 mL/min/1.73   GFR calc Af Amer 37 (L) >59 mL/min/1.73   BUN/Creatinine Ratio 15 12 - 28   Sodium 141 134 - 144 mmol/L   Potassium 3.9 3.5 - 5.2 mmol/L   Chloride 102 96 - 106 mmol/L   CO2 25 18 - 29 mmol/L   Calcium 9.9 8.7 - 10.3 mg/dL  LP+ALT+AST Piccolo, Waived  Result Value Ref Range   ALT (SGPT) Piccolo, Waived 15  10 - 47 U/L   AST (SGOT) Piccolo, Waived 27 11 - 38 U/L   Cholesterol Piccolo, Waived 193 <200 mg/dL   HDL Chol Piccolo, Waived 127 >59 mg/dL   Triglycerides Piccolo,Waived 165 (H) <150 mg/dL   Chol/HDL Ratio Piccolo,Waive 2.9 mg/dL   LDL Chol Calc Piccolo Waived 94 <100 mg/dL   VLDL Chol Calc Piccolo,Waive 33 (H) <30 mg/dL      Assessment & Plan:   Problem List Items Addressed This Visit      Cardiovascular and Mediastinum   Hypertension - Primary    The current medical regimen is effective;  continue present plan and medications.       Relevant Medications   atorvastatin (LIPITOR) 10 MG tablet   benazepril  (LOTENSIN) 10 MG tablet   hydrochlorothiazide (HYDRODIURIL) 25 MG tablet   metoprolol succinate (TOPROL-XL) 50 MG 24 hr tablet   Other Relevant Orders   CBC with Differential/Platelet   Comprehensive metabolic panel   Urinalysis, Routine w reflex microscopic     Digestive   Esophageal reflux    The current medical regimen is effective;  continue present plan and medications.       Relevant Medications   omeprazole (PRILOSEC) 20 MG capsule     Endocrine   Hypothyroidism    The current medical regimen is effective;  continue present plan and medications.       Relevant Medications   levothyroxine (SYNTHROID, LEVOTHROID) 50 MCG tablet   metoprolol succinate (TOPROL-XL) 50 MG 24 hr tablet   Other Relevant Orders   TSH     Musculoskeletal and Integument   Seborrheic keratosis    Has multiple seborrheic keratosis with skin changes on cheeks requesting further evaluation will refer to dermatology      Relevant Orders   Ambulatory referral to Dermatology     Genitourinary   Hypertensive kidney disease with CKD stage III ([place])    The current medical regimen is effective;  continue present plan and medications.         Other   Hyperlipidemia    The current medical regimen is effective;  continue present plan and medications.       Relevant Medications   atorvastatin (LIPITOR) 10 MG tablet   benazepril (LOTENSIN) 10 MG tablet   hydrochlorothiazide (HYDRODIURIL) 25 MG tablet   metoprolol succinate (TOPROL-XL) 50 MG 24 hr tablet   Other Relevant Orders   Lipid panel   Advanced care planning/counseling discussion    A voluntary discussion about advance care planning including the explanation and discussion of advance directives was extensively discussed  with the patient.  Explanation about the health care proxy and Living will was reviewed and packet with forms with explanation of how to fill them out was given.         Other Visit Diagnoses    PE (physical exam),  annual           Follow up plan: Return in about 6 months (around 05/15/2018) for BMP,  Lipids, ALT, AST.

## 2017-11-15 NOTE — Assessment & Plan Note (Signed)
Has multiple seborrheic keratosis with skin changes on cheeks requesting further evaluation will refer to dermatology

## 2017-11-16 LAB — CBC WITH DIFFERENTIAL/PLATELET
BASOS ABS: 0.1 10*3/uL (ref 0.0–0.2)
Basos: 1 %
EOS (ABSOLUTE): 0.1 10*3/uL (ref 0.0–0.4)
Eos: 1 %
HEMOGLOBIN: 10.9 g/dL — AB (ref 11.1–15.9)
Hematocrit: 33.9 % — ABNORMAL LOW (ref 34.0–46.6)
IMMATURE GRANS (ABS): 0 10*3/uL (ref 0.0–0.1)
IMMATURE GRANULOCYTES: 0 %
LYMPHS: 48 %
Lymphocytes Absolute: 4.7 10*3/uL — ABNORMAL HIGH (ref 0.7–3.1)
MCH: 28.2 pg (ref 26.6–33.0)
MCHC: 32.2 g/dL (ref 31.5–35.7)
MCV: 88 fL (ref 79–97)
MONOCYTES: 4 %
Monocytes Absolute: 0.4 10*3/uL (ref 0.1–0.9)
NEUTROS ABS: 4.5 10*3/uL (ref 1.4–7.0)
NEUTROS PCT: 46 %
PLATELETS: 272 10*3/uL (ref 150–379)
RBC: 3.86 x10E6/uL (ref 3.77–5.28)
RDW: 14.7 % (ref 12.3–15.4)
WBC: 9.7 10*3/uL (ref 3.4–10.8)

## 2017-11-16 LAB — COMPREHENSIVE METABOLIC PANEL
ALBUMIN: 4.9 g/dL — AB (ref 3.5–4.7)
ALT: 20 IU/L (ref 0–32)
AST: 19 IU/L (ref 0–40)
Albumin/Globulin Ratio: 2.1 (ref 1.2–2.2)
Alkaline Phosphatase: 82 IU/L (ref 39–117)
BILIRUBIN TOTAL: 0.3 mg/dL (ref 0.0–1.2)
BUN / CREAT RATIO: 18 (ref 12–28)
BUN: 29 mg/dL — AB (ref 8–27)
CALCIUM: 10 mg/dL (ref 8.7–10.3)
CHLORIDE: 102 mmol/L (ref 96–106)
CO2: 21 mmol/L (ref 20–29)
CREATININE: 1.59 mg/dL — AB (ref 0.57–1.00)
GFR calc non Af Amer: 30 mL/min/{1.73_m2} — ABNORMAL LOW (ref >59)
GFR, EST AFRICAN AMERICAN: 34 mL/min/{1.73_m2} — AB (ref >59)
GLUCOSE: 119 mg/dL — AB (ref 65–99)
Globulin, Total: 2.3 g/dL (ref 1.5–4.5)
Potassium: 4 mmol/L (ref 3.5–5.2)
Sodium: 140 mmol/L (ref 134–144)
TOTAL PROTEIN: 7.2 g/dL (ref 6.0–8.5)

## 2017-11-16 LAB — LIPID PANEL
Chol/HDL Ratio: 3.4 ratio (ref 0.0–4.4)
Cholesterol, Total: 202 mg/dL — ABNORMAL HIGH (ref 100–199)
HDL: 59 mg/dL (ref >39)
LDL Calculated: 113 mg/dL — ABNORMAL HIGH (ref 0–99)
Triglycerides: 151 mg/dL — ABNORMAL HIGH (ref 0–149)
VLDL CHOLESTEROL CAL: 30 mg/dL (ref 5–40)

## 2017-11-16 LAB — TSH: TSH: 0.946 u[IU]/mL (ref 0.450–4.500)

## 2018-02-19 DIAGNOSIS — H25013 Cortical age-related cataract, bilateral: Secondary | ICD-10-CM | POA: Diagnosis not present

## 2018-02-19 DIAGNOSIS — H524 Presbyopia: Secondary | ICD-10-CM | POA: Diagnosis not present

## 2018-05-07 ENCOUNTER — Telehealth: Payer: Self-pay | Admitting: Family Medicine

## 2018-05-07 NOTE — Telephone Encounter (Signed)
Copied from [place] [id number]. Topic: Quick Communication - Rx Refill/Question >> May 07, 2018  8:38 AM Synthia Innocent wrote: Medication: atorvastatin (LIPITOR) 10 MG tablet and metoprolol succinate (TOPROL-XL) 50 MG 24 hr tablet   Has the patient contacted their pharmacy? Yes.   (Agent: If no, request that the patient contact the pharmacy for the refill.) (Agent: If yes, when and what did the pharmacy advise?)  Preferred Pharmacy (with phone number or street name): [place]: Please be advised that RX refills may take up to 3 business days. We ask that you follow-up with your pharmacy.

## 2018-05-07 NOTE — Telephone Encounter (Signed)
Left message for pt to contact pharmacy for refills on requested medications. Pt has refills available at [facility]

## 2018-05-15 ENCOUNTER — Ambulatory Visit (INDEPENDENT_AMBULATORY_CARE_PROVIDER_SITE_OTHER): Payer: Medicare HMO | Admitting: Family Medicine

## 2018-05-15 ENCOUNTER — Encounter: Payer: Self-pay | Admitting: Family Medicine

## 2018-05-15 VITALS — BP 133/68 | HR 48 | Ht 66.0 in | Wt 143.0 lb

## 2018-05-15 DIAGNOSIS — I10 Essential (primary) hypertension: Secondary | ICD-10-CM

## 2018-05-15 DIAGNOSIS — E7800 Pure hypercholesterolemia, unspecified: Secondary | ICD-10-CM

## 2018-05-15 DIAGNOSIS — R001 Bradycardia, unspecified: Secondary | ICD-10-CM | POA: Diagnosis not present

## 2018-05-15 LAB — LP+ALT+AST PICCOLO, WAIVED
ALT (SGPT) Piccolo, Waived: 26 U/L (ref 10–47)
AST (SGOT) PICCOLO, WAIVED: 24 U/L (ref 11–38)
CHOL/HDL RATIO PICCOLO,WAIVE: 3 mg/dL
Cholesterol Piccolo, Waived: 184 mg/dL (ref <200)
HDL Chol Piccolo, Waived: 62 mg/dL (ref >59)
LDL Chol Calc Piccolo Waived: 94 mg/dL (ref <100)
TRIGLYCERIDES PICCOLO,WAIVED: 136 mg/dL (ref <150)
VLDL Chol Calc Piccolo,Waive: 27 mg/dL (ref <30)

## 2018-05-15 NOTE — Assessment & Plan Note (Signed)
For bradycardia will decrease metoprolol from 50 to 25 mg and observe response

## 2018-05-15 NOTE — Progress Notes (Signed)
BP 133/68   Pulse (!) 48   Ht 5\' 6"  (1.676 m)   Wt 143 lb (64.9 kg)   SpO2 98%   BMI 23.08 kg/m    Subjective:    Patient ID: Michelle Novak, female    DOB: 1932/04/02, 84 y.o.   MRN: [id number]  HPI: Michelle Novak is a 84 y.o. female  Chief Complaint  Patient presents with  . Follow-up  . Hypertension  . Hyperlipidemia  Follow-up all in all doing well no complaints no low blood pressure spells no issues with medications no problems or concerns with cholesterol or thyroid. No complaints from bradycardia  Relevant past medical, surgical, family and social history reviewed and updated as indicated. Interim medical history since our last visit reviewed. Allergies and medications reviewed and updated.  Review of Systems  Constitutional: Negative.   Respiratory: Negative.   Cardiovascular: Negative.     Per HPI unless specifically indicated above     Objective:    BP 133/68   Pulse (!) 48   Ht 5\' 6"  (1.676 m)   Wt 143 lb (64.9 kg)   SpO2 98%   BMI 23.08 kg/m   Wt Readings from Last 3 Encounters:  05/15/18 143 lb (64.9 kg)  11/15/17 144 lb (65.3 kg)  10/03/17 144 lb 12.8 oz (65.7 kg)    Physical Exam  Constitutional: She is oriented to person, place, and time. She appears well-developed and well-nourished.  HENT:  Head: Normocephalic and atraumatic.  Eyes: Conjunctivae and EOM are normal.  Neck: Normal range of motion.  Cardiovascular: Normal rate, regular rhythm and normal heart sounds.  Pulmonary/Chest: Effort normal and breath sounds normal.  Musculoskeletal: Normal range of motion.  Neurological: She is alert and oriented to person, place, and time.  Skin: No erythema.  Psychiatric: She has a normal mood and affect. Her behavior is normal. Judgment and thought content normal.  EKG normal sinus rhythm with rate 62.  Results for orders placed or performed in visit on 11/15/17  Microscopic Examination  Result Value Ref Range   WBC, UA 11-30  (A) 0 - 5 /hpf   RBC, UA 0-2 0 - 2 /hpf   Epithelial Cells (non renal) 0-10 0 - 10 /hpf   Bacteria, UA Many (A) None seen/Few  CBC with Differential/Platelet  Result Value Ref Range   WBC 9.7 3.4 - 10.8 x10E3/uL   RBC 3.86 3.77 - 5.28 x10E6/uL   Hemoglobin 10.9 (L) 11.1 - 15.9 g/dL   Hematocrit 33.9 (L) 34.0 - 46.6 %   MCV 88 79 - 97 fL   MCH 28.2 26.6 - 33.0 pg   MCHC 32.2 31.5 - 35.7 g/dL   RDW 14.7 12.3 - 15.4 %   Platelets 272 150 - 379 x10E3/uL   Neutrophils 46 Not Estab. %   Lymphs 48 Not Estab. %   Monocytes 4 Not Estab. %   Eos 1 Not Estab. %   Basos 1 Not Estab. %   Neutrophils Absolute 4.5 1.4 - 7.0 x10E3/uL   Lymphocytes Absolute 4.7 (H) 0.7 - 3.1 x10E3/uL   Monocytes Absolute 0.4 0.1 - 0.9 x10E3/uL   EOS (ABSOLUTE) 0.1 0.0 - 0.4 x10E3/uL   Basophils Absolute 0.1 0.0 - 0.2 x10E3/uL   Immature Granulocytes 0 Not Estab. %   Immature Grans (Abs) 0.0 0.0 - 0.1 x10E3/uL  Comprehensive metabolic panel  Result Value Ref Range   Glucose 119 (H) 65 - 99 mg/dL   BUN 29 (H) 8 -  27 mg/dL   Creatinine, Ser 1.59 (H) 0.57 - 1.00 mg/dL   GFR calc non Af Amer 30 (L) >59 mL/min/1.73   GFR calc Af Amer 34 (L) >59 mL/min/1.73   BUN/Creatinine Ratio 18 12 - 28   Sodium 140 134 - 144 mmol/L   Potassium 4.0 3.5 - 5.2 mmol/L   Chloride 102 96 - 106 mmol/L   CO2 21 20 - 29 mmol/L   Calcium 10.0 8.7 - 10.3 mg/dL   Total Protein 7.2 6.0 - 8.5 g/dL   Albumin 4.9 (H) 3.5 - 4.7 g/dL   Globulin, Total 2.3 1.5 - 4.5 g/dL   Albumin/Globulin Ratio 2.1 1.2 - 2.2   Bilirubin Total 0.3 0.0 - 1.2 mg/dL   Alkaline Phosphatase 82 39 - 117 IU/L   AST 19 0 - 40 IU/L   ALT 20 0 - 32 IU/L  Lipid panel  Result Value Ref Range   Cholesterol, Total 202 (H) 100 - 199 mg/dL   Triglycerides 151 (H) 0 - 149 mg/dL   HDL 59 >39 mg/dL   VLDL Cholesterol Cal 30 5 - 40 mg/dL   LDL Calculated 113 (H) 0 - 99 mg/dL   Chol/HDL Ratio 3.4 0.0 - 4.4 ratio  TSH  Result Value Ref Range   TSH 0.946 0.450 - 4.500  uIU/mL  Urinalysis, Routine w reflex microscopic  Result Value Ref Range   Specific Gravity, UA 1.010 1.005 - 1.030   pH, UA 6.0 5.0 - 7.5   Color, UA Yellow Yellow   Appearance Ur Cloudy (A) Clear   Leukocytes, UA 3+ (A) Negative   Protein, UA Negative Negative/Trace   Glucose, UA Negative Negative   Ketones, UA Negative Negative   RBC, UA Trace (A) Negative   Bilirubin, UA Negative Negative   Urobilinogen, Ur 0.2 0.2 - 1.0 mg/dL   Nitrite, UA Negative Negative   Microscopic Examination See below:       Assessment & Plan:   Problem List Items Addressed This Visit      Cardiovascular and Mediastinum   Hypertension - Primary    Hypertension good control will decrease metoprolol from 50 mg to 25 mg gave written directions.  Observe blood pressure and pulse.      Relevant Orders   Basic metabolic panel   LP+ALT+AST Piccolo, Waived     Other   Hyperlipidemia    The current medical regimen is effective;  continue present plan and medications.       Relevant Orders   Basic metabolic panel   LP+ALT+AST Piccolo, Waived   Bradycardia    For bradycardia will decrease metoprolol from 50 to 25 mg and observe response      Relevant Orders   EKG 12-Lead (Completed)       Follow up plan: Return in about 6 months (around 11/15/2018) for Physical Exam.

## 2018-05-15 NOTE — Assessment & Plan Note (Signed)
The current medical regimen is effective;  continue present plan and medications.  

## 2018-05-15 NOTE — Assessment & Plan Note (Signed)
Hypertension good control will decrease metoprolol from 50 mg to 25 mg gave written directions.  Observe blood pressure and pulse.

## 2018-05-16 ENCOUNTER — Encounter: Payer: Self-pay | Admitting: Family Medicine

## 2018-05-16 LAB — BASIC METABOLIC PANEL
BUN / CREAT RATIO: 14 (ref 12–28)
BUN: 22 mg/dL (ref 8–27)
CALCIUM: 9.8 mg/dL (ref 8.7–10.3)
CHLORIDE: 104 mmol/L (ref 96–106)
CO2: 24 mmol/L (ref 20–29)
Creatinine, Ser: 1.57 mg/dL — ABNORMAL HIGH (ref 0.57–1.00)
GFR calc non Af Amer: 30 mL/min/{1.73_m2} — ABNORMAL LOW (ref >59)
GFR, EST AFRICAN AMERICAN: 35 mL/min/{1.73_m2} — AB (ref >59)
Glucose: 96 mg/dL (ref 65–99)
POTASSIUM: 4.2 mmol/L (ref 3.5–5.2)
Sodium: 143 mmol/L (ref 134–144)

## 2018-10-24 ENCOUNTER — Ambulatory Visit (INDEPENDENT_AMBULATORY_CARE_PROVIDER_SITE_OTHER): Payer: Medicare HMO

## 2018-10-24 ENCOUNTER — Ambulatory Visit: Payer: Medicare HMO

## 2018-10-24 VITALS — BP 118/62 | HR 89 | Temp 97.7°F | Ht 66.0 in | Wt 144.0 lb

## 2018-10-24 DIAGNOSIS — Z0000 Encounter for general adult medical examination without abnormal findings: Secondary | ICD-10-CM | POA: Diagnosis not present

## 2018-10-24 MED ORDER — ZOSTER VAC RECOMB ADJUVANTED 50 MCG/0.5ML IM SUSR: 0.5000 mL | Freq: Once | INTRAMUSCULAR | 1 refills | Status: AC

## 2018-10-24 NOTE — Progress Notes (Signed)
Subjective:   Michelle Novak is a 84 y.o. female who presents for Medicare Annual (Subsequent) preventive examination.    Objective:     Vitals: BP 118/62 (BP Location: Left Arm, Patient Position: Sitting)   Pulse 89   Temp 97.7 F (36.5 C) (Oral)   Ht 5\' 6"  (1.676 m)   Wt 144 lb (65.3 kg)   SpO2 98%   BMI 23.24 kg/m   Body mass index is 23.24 kg/m.  Advanced Directives 10/24/2018 10/03/2017 04/28/2015  Does Patient Have a Medical Advance Directive? Yes Yes Yes  Type of Paramedic of [place];Living will -  Does patient want to make changes to medical advance directive? No - Patient declined - -  Copy of [place] in Chart? Yes - validated most recent copy scanned in chart (See row information) No - copy requested -    Tobacco Social History   Tobacco Use  Smoking Status Never Smoker  Smokeless Tobacco Never Used     Counseling given: Not Answered   Clinical Intake:  Pre-visit preparation completed: No  Pain : No/denies pain     Diabetes: No  How often do you need to have someone help you when you read instructions, pamphlets, or other written materials from your doctor or pharmacy?: 1 - Never What is the last grade level you completed in school?: HS  Interpreter Needed?: No  Information entered by :: Tyson Dense, RN  Past Medical History:  Diagnosis Date  . Arthritis   . Chronic kidney disease   . Esophageal reflux   . Heart murmur   . History of abnormal cervical Pap smear   . Hyperlipidemia   . Hypertension   . Hypothyroidism   . Osteoporosis    Past Surgical History:  Procedure Laterality Date  . ABDOMINAL HYSTERECTOMY    . JOINT REPLACEMENT     Family History  Problem Relation Age of Onset  . Heart disease Mother   . Heart attack Mother   . Cancer Father        bone  . Heart disease Sister    Social History   Socioeconomic History  . Marital status:  Widowed    Spouse name: Not on file  . Number of children: Not on file  . Years of education: 44  . Highest education level: 12th grade  Occupational History  . Not on file  Social Needs  . Financial resource strain: Not hard at all  . Food insecurity:    Worry: Never true    Inability: Never true  . Transportation needs:    Medical: No    Non-medical: No  Tobacco Use  . Smoking status: Never Smoker  . Smokeless tobacco: Never Used  Substance and Sexual Activity  . Alcohol use: No  . Drug use: No  . Sexual activity: Not on file  Lifestyle  . Physical activity:    Days per week: 0 days    Minutes per session: 0 min  . Stress: Not at all  Relationships  . Social connections:    Talks on phone: More than three times a week    Gets together: More than three times a week    Attends religious service: More than 4 times per year    Active member of club or organization: Yes    Attends meetings of clubs or organizations: More than 4 times per year    Relationship status: Widowed  Other  Topics Concern  . Not on file  Social History Narrative  . Not on file    Outpatient Encounter Medications as of 10/24/2018  Medication Sig  . aspirin EC 81 MG tablet Take 81 mg by mouth daily.  Marland Kitchen atorvastatin (LIPITOR) 10 MG tablet Take 1 tablet (10 mg total) by mouth at bedtime.  . benazepril (LOTENSIN) 10 MG tablet Take 1 tablet (10 mg total) by mouth daily.  . hydrochlorothiazide (HYDRODIURIL) 25 MG tablet Take 1 tablet (25 mg total) by mouth daily.  Marland Kitchen levothyroxine (SYNTHROID, LEVOTHROID) 50 MCG tablet Take 1 tablet (50 mcg total) by mouth daily before breakfast.  . metoprolol succinate (TOPROL-XL) 50 MG 24 hr tablet Take 1 tablet (50 mg total) by mouth daily. Take with or immediately following a meal.  . omeprazole (PRILOSEC) 20 MG capsule Take 1 capsule (20 mg total) by mouth daily.  Marland Kitchen Zoster Vaccine Adjuvanted [facility]) injection Inject 0.5 mLs into the muscle once for 1 dose.  .  [DISCONTINUED] Zoster Vaccine Adjuvanted [facility]) injection Inject 0.5 mLs into the muscle once.   No facility-administered encounter medications on file as of 10/24/2018.     Activities of Daily Living In your present state of health, do you have any difficulty performing the following activities: 10/24/2018  Hearing? N  Vision? N  Difficulty concentrating or making decisions? N  Walking or climbing stairs? N  Dressing or bathing? N  Doing errands, shopping? N  Preparing Food and eating ? N  Using the Toilet? N  In the past six months, have you accidently leaked urine? Y  Comment wears pads  Do you have problems with loss of bowel control? N  Managing your Medications? N  Managing your Finances? N  Housekeeping or managing your Housekeeping? N  Some recent data might be hidden    Patient Care Team: Guadalupe Maple, MD as PCP - General (Family Medicine)    Assessment:   This is a routine wellness examination for [facility].  Exercise Activities and Dietary recommendations Current Exercise Habits: The patient does not participate in regular exercise at present, Exercise limited by: None identified  Goals    . DIET - INCREASE WATER INTAKE     Recommend drinking at least 2-3 bottles of water a day        Fall Risk Fall Risk  10/24/2018 05/15/2018 11/15/2017 10/03/2017 04/03/2017  Falls in the past year? 0 No No No No  Number falls in past yr: 0 - - - -  Injury with Fall? 0 - - - -   Is the patient's home free of loose throw rugs in walkways, pet beds, electrical cords, etc?   yes      Grab bars in the bathroom? yes      Handrails on the stairs?   yes      Adequate lighting?   yes  Depression Screen PHQ 2/9 Scores 10/24/2018 05/15/2018 11/15/2017 10/03/2017  PHQ - 2 Score 0 0 0 0  PHQ- 9 Score - - - 1     Cognitive Function     6CIT Screen 10/24/2018 10/03/2017  What Year? 0 points 0 points  What month? 0 points 0 points  What time? 0 points 0 points  Count  back from 20 0 points 0 points  Months in reverse 0 points 0 points  Repeat phrase 6 points 2 points  Total Score 6 2    Immunization History  Administered Date(s) Administered  . Influenza, High Dose Seasonal PF  10/02/2016  . Influenza-Unspecified 08/21/2013, 08/17/2015, 08/28/2017, 08/26/2018  . Pneumococcal Conjugate-13 09/23/2014  . Pneumococcal Polysaccharide-23 06/28/2005  . Tdap 07/30/2009  . Zoster 06/14/2006    Qualifies for Shingles Vaccine? Yes, educated and prescription sent to pharmacy  Screening Tests Health Maintenance  Topic Date Due  . TETANUS/TDAP  07/31/2019  . INFLUENZA VACCINE  Completed  . DEXA SCAN  Completed  . PNA vac Low Risk Adult  Completed    Cancer Screenings: Lung: Low Dose CT Chest recommended if Age 50-80 years, 30 pack-year currently smoking OR have quit w/in 15years. Patient does not qualify. Breast:  Up to date on Mammogram? Yes   Up to date of Bone Density/Dexa? Yes Colorectal: up to date  Additional Screenings:  Hepatitis C Screening: declined     Plan:    I have personally reviewed and addressed the Medicare Annual Wellness questionnaire and have noted the following in the patient's chart:  A. Medical and social history B. Use of alcohol, tobacco or illicit drugs  C. Current medications and supplements D. Functional ability and status E.  Nutritional status F.  Physical activity G. Advance directives H. List of other physicians I.  Hospitalizations, surgeries, and ER visits in previous 12 months J.  [place] to include hearing, vision, cognitive, depression L. Referrals and appointments - none  In addition, I have reviewed and discussed with patient certain preventive protocols, quality metrics, and best practice recommendations. A written personalized care plan for preventive services as well as general preventive health recommendations were provided to patient.  See attached scanned questionnaire for additional  information.   Signed,   Tyson Dense, RN Nurse Health Advisor  Patient Concerns: Has cold symptoms currently

## 2018-10-24 NOTE — Patient Instructions (Signed)
Michelle Novak , Thank you for taking time to come for your Medicare Wellness Visit. I appreciate your ongoing commitment to your health goals. Please review the following plan we discussed and let me know if I can assist you in the future.   Screening recommendations/referrals: Colonoscopy excluded, over age 82 Mammogram excluded, over age 44 Bone Density up to date Recommended yearly ophthalmology/optometry visit for glaucoma screening and checkup Recommended yearly dental visit for hygiene and checkup  Vaccinations: Influenza vaccine up to date Pneumococcal vaccine up to date, completed Tdap vaccine up to date, due 07/31/2019  Shingles vaccine due, ordered to pharmacy    Advanced directives: In chart  Conditions/risks identified: none  Next appointment: Dr. Jeananne Rama 11/25/2018 @ 9am            Medicare Wellness visit 10/27/2019 @ 10:15am   Preventive Care 65 Years and Older, Female Preventive care refers to lifestyle choices and visits with your health care provider that can promote health and wellness. What does preventive care include?  A yearly physical exam. This is also called an annual well check.  Dental exams once or twice a year.  Routine eye exams. Ask your health care provider how often you should have your eyes checked.  Personal lifestyle choices, including:  Daily care of your teeth and gums.  Regular physical activity.  Eating a healthy diet.  Avoiding tobacco and drug use.  Limiting alcohol use.  Practicing safe sex.  Taking low-dose aspirin every day.  Taking vitamin and mineral supplements as recommended by your health care provider. What happens during an annual well check? The services and screenings done by your health care provider during your annual well check will depend on your age, overall health, lifestyle risk factors, and family history of disease. Counseling  Your health care provider may ask you questions about your:  Alcohol  use.  Tobacco use.  Drug use.  Emotional well-being.  Home and relationship well-being.  Sexual activity.  Eating habits.  History of falls.  Memory and ability to understand (cognition).  Work and work Statistician.  Reproductive health. Screening  You may have the following tests or measurements:  Height, weight, and BMI.  Blood pressure.  Lipid and cholesterol levels. These may be checked every 5 years, or more frequently if you are over 56 years old.  Skin check.  Lung cancer screening. You may have this screening every year starting at age 53 if you have a 30-pack-year history of smoking and currently smoke or have quit within the past 15 years.  Fecal occult blood test (FOBT) of the stool. You may have this test every year starting at age 68.  Flexible sigmoidoscopy or colonoscopy. You may have a sigmoidoscopy every 5 years or a colonoscopy every 10 years starting at age 45.  Hepatitis C blood test.  Hepatitis B blood test.  Sexually transmitted disease (STD) testing.  Diabetes screening. This is done by checking your blood sugar (glucose) after you have not eaten for a while (fasting). You may have this done every 1-3 years.  Bone density scan. This is done to screen for osteoporosis. You may have this done starting at age 28.  Mammogram. This may be done every 1-2 years. Talk to your health care provider about how often you should have regular mammograms. Talk with your health care provider about your test results, treatment options, and if necessary, the need for more tests. Vaccines  Your health care provider may recommend certain vaccines, such as:  Influenza vaccine. This is recommended every year.  Tetanus, diphtheria, and acellular pertussis (Tdap, Td) vaccine. You may need a Td booster every 10 years.  Zoster vaccine. You may need this after age 17.  Pneumococcal 13-valent conjugate (PCV13) vaccine. One dose is recommended after age  66.  Pneumococcal polysaccharide (PPSV23) vaccine. One dose is recommended after age 72. Talk to your health care provider about which screenings and vaccines you need and how often you need them. This information is not intended to replace advice given to you by your health care provider. Make sure you discuss any questions you have with your health care provider. Document Released: 11/19/2015 Document Revised: 07/12/2016 Document Reviewed: 08/24/2015 Elsevier Interactive Patient Education  2017 [place] Prevention in the Home Falls can cause injuries. They can happen to people of all ages. There are many things you can do to make your home safe and to help prevent falls. What can I do on the outside of my home?  Regularly fix the edges of walkways and driveways and fix any cracks.  Remove anything that might make you trip as you walk through a door, such as a raised step or threshold.  Trim any bushes or trees on the path to your home.  Use bright outdoor lighting.  Clear any walking paths of anything that might make someone trip, such as rocks or tools.  Regularly check to see if handrails are loose or broken. Make sure that both sides of any steps have handrails.  Any raised decks and porches should have guardrails on the edges.  Have any leaves, snow, or ice cleared regularly.  Use sand or salt on walking paths during winter.  Clean up any spills in your garage right away. This includes oil or grease spills. What can I do in the bathroom?  Use night lights.  Install grab bars by the toilet and in the tub and shower. Do not use towel bars as grab bars.  Use non-skid mats or decals in the tub or shower.  If you need to sit down in the shower, use a plastic, non-slip stool.  Keep the floor dry. Clean up any water that spills on the floor as soon as it happens.  Remove soap buildup in the tub or shower regularly.  Attach bath mats securely with double-sided  non-slip rug tape.  Do not have throw rugs and other things on the floor that can make you trip. What can I do in the bedroom?  Use night lights.  Make sure that you have a light by your bed that is easy to reach.  Do not use any sheets or blankets that are too big for your bed. They should not hang down onto the floor.  Have a firm chair that has side arms. You can use this for support while you get dressed.  Do not have throw rugs and other things on the floor that can make you trip. What can I do in the kitchen?  Clean up any spills right away.  Avoid walking on wet floors.  Keep items that you use a lot in easy-to-reach places.  If you need to reach something above you, use a strong step stool that has a grab bar.  Keep electrical cords out of the way.  Do not use floor polish or wax that makes floors slippery. If you must use wax, use non-skid floor wax.  Do not have throw rugs and other things on the floor that  can make you trip. What can I do with my stairs?  Do not leave any items on the stairs.  Make sure that there are handrails on both sides of the stairs and use them. Fix handrails that are broken or loose. Make sure that handrails are as long as the stairways.  Check any carpeting to make sure that it is firmly attached to the stairs. Fix any carpet that is loose or worn.  Avoid having throw rugs at the top or bottom of the stairs. If you do have throw rugs, attach them to the floor with carpet tape.  Make sure that you have a light switch at the top of the stairs and the bottom of the stairs. If you do not have them, ask someone to add them for you. What else can I do to help prevent falls?  Wear shoes that:  Do not have high heels.  Have rubber bottoms.  Are comfortable and fit you well.  Are closed at the toe. Do not wear sandals.  If you use a stepladder:  Make sure that it is fully opened. Do not climb a closed stepladder.  Make sure that both  sides of the stepladder are locked into place.  Ask someone to hold it for you, if possible.  Clearly mark and make sure that you can see:  Any grab bars or handrails.  First and last steps.  Where the edge of each step is.  Use tools that help you move around (mobility aids) if they are needed. These include:  Canes.  Walkers.  Scooters.  Crutches.  Turn on the lights when you go into a dark area. Replace any light bulbs as soon as they burn out.  Set up your furniture so you have a clear path. Avoid moving your furniture around.  If any of your floors are uneven, fix them.  If there are any pets around you, be aware of where they are.  Review your medicines with your doctor. Some medicines can make you feel dizzy. This can increase your chance of falling. Ask your doctor what other things that you can do to help prevent falls. This information is not intended to replace advice given to you by your health care provider. Make sure you discuss any questions you have with your health care provider. Document Released: 08/19/2009 Document Revised: 03/30/2016 Document Reviewed: 11/27/2014 Elsevier Interactive Patient Education  2017 [employer].

## 2018-11-25 ENCOUNTER — Other Ambulatory Visit: Payer: Self-pay | Admitting: Family Medicine

## 2018-11-25 ENCOUNTER — Encounter: Payer: Self-pay | Admitting: Family Medicine

## 2018-11-25 ENCOUNTER — Ambulatory Visit (INDEPENDENT_AMBULATORY_CARE_PROVIDER_SITE_OTHER): Payer: Medicare HMO | Admitting: Family Medicine

## 2018-11-25 VITALS — BP 122/73 | HR 72 | Temp 98.4°F | Ht 62.4 in | Wt 141.0 lb

## 2018-11-25 DIAGNOSIS — E7800 Pure hypercholesterolemia, unspecified: Secondary | ICD-10-CM

## 2018-11-25 DIAGNOSIS — K219 Gastro-esophageal reflux disease without esophagitis: Secondary | ICD-10-CM

## 2018-11-25 DIAGNOSIS — Z7189 Other specified counseling: Secondary | ICD-10-CM | POA: Diagnosis not present

## 2018-11-25 DIAGNOSIS — I10 Essential (primary) hypertension: Secondary | ICD-10-CM

## 2018-11-25 DIAGNOSIS — E039 Hypothyroidism, unspecified: Secondary | ICD-10-CM

## 2018-11-25 MED ORDER — OMEPRAZOLE 20 MG PO CPDR: 20.0000 mg | DELAYED_RELEASE_CAPSULE | Freq: Every day | ORAL | 4 refills | Status: DC

## 2018-11-25 MED ORDER — BENAZEPRIL HCL 10 MG PO TABS: 10.0000 mg | ORAL_TABLET | Freq: Every day | ORAL | 4 refills | Status: DC

## 2018-11-25 MED ORDER — LEVOTHYROXINE SODIUM 50 MCG PO TABS: 50.0000 ug | ORAL_TABLET | Freq: Every day | ORAL | 4 refills | Status: DC

## 2018-11-25 MED ORDER — ATORVASTATIN CALCIUM 10 MG PO TABS: 10.0000 mg | ORAL_TABLET | Freq: Every day | ORAL | 4 refills | Status: DC

## 2018-11-25 MED ORDER — METOPROLOL SUCCINATE ER 50 MG PO TB24: 50.0000 mg | ORAL_TABLET | Freq: Every day | ORAL | 4 refills | Status: DC

## 2018-11-25 MED ORDER — HYDROCHLOROTHIAZIDE 25 MG PO TABS: 25.0000 mg | ORAL_TABLET | Freq: Every day | ORAL | 4 refills | Status: DC

## 2018-11-25 NOTE — Assessment & Plan Note (Signed)
Stable

## 2018-11-25 NOTE — Progress Notes (Signed)
BP 122/73   Pulse 72   Temp 98.4 F (36.9 C) (Oral)   Ht 5' 2.4" (1.585 m)   Wt 141 lb (64 kg)   SpO2 97%   BMI 25.46 kg/m    Subjective:    Patient ID: Michelle Novak, female    DOB: Mar 14, 1932, 85 y.o.   MRN: [id number]  HPI: Michelle Novak is a 85 y.o. female  Chief Complaint  Patient presents with  . Annual Exam    No issues or concerns   . Health Maintenance    UTD   Patient all in all doing well no complaints blood pressure doing well with good control taking medications without problems or issues. Takes thyroid medications faithfully without problems or issues no thyroid symptoms. Reflux controlled well with omeprazole no complaints from medications.  Relevant past medical, surgical, family and social history reviewed and updated as indicated. Interim medical history since our last visit reviewed. Allergies and medications reviewed and updated.  Review of Systems  Constitutional: Negative.   HENT: Negative.   Eyes: Negative.   Respiratory: Negative.   Cardiovascular: Negative.   Gastrointestinal: Negative.   Endocrine: Negative.   Genitourinary: Negative.   Musculoskeletal: Negative.   Skin: Negative.   Allergic/Immunologic: Negative.   Neurological: Negative.   Hematological: Negative.   Psychiatric/Behavioral: Negative.     Per HPI unless specifically indicated above     Objective:    BP 122/73   Pulse 72   Temp 98.4 F (36.9 C) (Oral)   Ht 5' 2.4" (1.585 m)   Wt 141 lb (64 kg)   SpO2 97%   BMI 25.46 kg/m   Wt Readings from Last 3 Encounters:  11/25/18 141 lb (64 kg)  10/24/18 144 lb (65.3 kg)  05/15/18 143 lb (64.9 kg)    Physical Exam Constitutional:      Appearance: She is well-developed.  HENT:     Head: Normocephalic and atraumatic.     Right Ear: External ear normal.     Left Ear: External ear normal.     Nose: Nose normal.  Eyes:     Conjunctiva/sclera: Conjunctivae normal.     Pupils: Pupils are equal, round,  and reactive to light.  Neck:     Musculoskeletal: Normal range of motion and neck supple.     Vascular: No carotid bruit.  Cardiovascular:     Rate and Rhythm: Normal rate and regular rhythm.     Heart sounds: Normal heart sounds. No murmur.  Pulmonary:     Effort: Pulmonary effort is normal.     Breath sounds: Normal breath sounds.  Chest:     Chest wall: No mass.     Breasts: Breasts are symmetrical.        Right: No mass, skin change or tenderness.        Left: No mass, skin change or tenderness.  Abdominal:     General: Bowel sounds are normal.     Palpations: Abdomen is soft.  Musculoskeletal: Normal range of motion.  Skin:    Findings: No rash.  Neurological:     Mental Status: She is alert and oriented to person, place, and time.  Psychiatric:        Behavior: Behavior normal.        Thought Content: Thought content normal.        Judgment: Judgment normal.     Results for orders placed or performed in visit on 92/11/94  Basic metabolic panel  Result Value Ref Range   Glucose 96 65 - 99 mg/dL   BUN 22 8 - 27 mg/dL   Creatinine, Ser 1.57 (H) 0.57 - 1.00 mg/dL   GFR calc non Af Amer 30 (L) >59 mL/min/1.73   GFR calc Af Amer 35 (L) >59 mL/min/1.73   BUN/Creatinine Ratio 14 12 - 28   Sodium 143 134 - 144 mmol/L   Potassium 4.2 3.5 - 5.2 mmol/L   Chloride 104 96 - 106 mmol/L   CO2 24 20 - 29 mmol/L   Calcium 9.8 8.7 - 10.3 mg/dL  LP+ALT+AST Piccolo, Waived  Result Value Ref Range   ALT (SGPT) Piccolo, Waived 26 10 - 47 U/L   AST (SGOT) Piccolo, Waived 24 11 - 38 U/L   Cholesterol Piccolo, Waived 184 <200 mg/dL   HDL Chol Piccolo, Waived 62 >59 mg/dL   Triglycerides Piccolo,Waived 136 <150 mg/dL   Chol/HDL Ratio Piccolo,Waive 3.0 mg/dL   LDL Chol Calc Piccolo Waived 94 <100 mg/dL   VLDL Chol Calc Piccolo,Waive 27 <30 mg/dL      Assessment & Plan:   Problem List Items Addressed This Visit      Cardiovascular and Mediastinum   Hypertension - Primary     The current medical regimen is effective;  continue present plan and medications.       Relevant Orders   CBC with Differential/Platelet   Comprehensive metabolic panel   Lipid panel   Urinalysis, Routine w reflex microscopic     Digestive   Esophageal reflux    The current medical regimen is effective;  continue present plan and medications.         Endocrine   Hypothyroidism    The current medical regimen is effective;  continue present plan and medications.       Relevant Orders   TSH     Other   Hyperlipidemia    Stable       Relevant Orders   CBC with Differential/Platelet   Comprehensive metabolic panel   Lipid panel   Urinalysis, Routine w reflex microscopic   Advanced care planning/counseling discussion    A voluntary discussion about advanced care planning including explanation and discussion of advanced directives was extentively discussed with the patient.  Explained about the healthcare proxy and living will was reviewed and packet with forms with expiration of how to fill them out was given.  Time spent: Encounter 16+ min individuals present: Patient          Follow up plan: Return in about 6 months (around 05/26/2019) for BMP,  Lipids, ALT, AST.

## 2018-11-25 NOTE — Assessment & Plan Note (Signed)
The current medical regimen is effective;  continue present plan and medications.  

## 2018-11-25 NOTE — Assessment & Plan Note (Signed)
A voluntary discussion about advanced care planning including explanation and discussion of advanced directives was extentively discussed with the patient.  Explained about the healthcare proxy and living will was reviewed and packet with forms with expiration of how to fill them out was given.  Time spent: Encounter 16+ min individuals present: Patient 

## 2018-11-26 ENCOUNTER — Encounter: Payer: Self-pay | Admitting: Family Medicine

## 2018-11-26 LAB — CBC WITH DIFFERENTIAL/PLATELET
Basophils Absolute: 0 10*3/uL (ref 0.0–0.2)
Basos: 1 %
EOS (ABSOLUTE): 0.1 10*3/uL (ref 0.0–0.4)
Eos: 1 %
HEMOGLOBIN: 10.8 g/dL — AB (ref 11.1–15.9)
Hematocrit: 34.1 % (ref 34.0–46.6)
Immature Grans (Abs): 0 10*3/uL (ref 0.0–0.1)
Immature Granulocytes: 0 %
Lymphocytes Absolute: 3.7 10*3/uL — ABNORMAL HIGH (ref 0.7–3.1)
Lymphs: 49 %
MCH: 28.2 pg (ref 26.6–33.0)
MCHC: 31.7 g/dL (ref 31.5–35.7)
MCV: 89 fL (ref 79–97)
MONOCYTES: 6 %
Monocytes Absolute: 0.4 10*3/uL (ref 0.1–0.9)
Neutrophils Absolute: 3.1 10*3/uL (ref 1.4–7.0)
Neutrophils: 43 %
Platelets: 255 10*3/uL (ref 150–450)
RBC: 3.83 x10E6/uL (ref 3.77–5.28)
RDW: 13.8 % (ref 11.7–15.4)
WBC: 7.3 10*3/uL (ref 3.4–10.8)

## 2018-11-26 LAB — COMPREHENSIVE METABOLIC PANEL
ALT: 18 IU/L (ref 0–32)
AST: 18 IU/L (ref 0–40)
Albumin/Globulin Ratio: 1.8 (ref 1.2–2.2)
Albumin: 4.5 g/dL (ref 3.6–4.6)
Alkaline Phosphatase: 76 IU/L (ref 39–117)
BUN / CREAT RATIO: 14 (ref 12–28)
BUN: 23 mg/dL (ref 8–27)
Bilirubin Total: 0.4 mg/dL (ref 0.0–1.2)
CO2: 22 mmol/L (ref 20–29)
Calcium: 9.8 mg/dL (ref 8.7–10.3)
Chloride: 105 mmol/L (ref 96–106)
Creatinine, Ser: 1.59 mg/dL — ABNORMAL HIGH (ref 0.57–1.00)
GFR calc Af Amer: 34 mL/min/{1.73_m2} — ABNORMAL LOW (ref >59)
GFR calc non Af Amer: 30 mL/min/{1.73_m2} — ABNORMAL LOW (ref >59)
Globulin, Total: 2.5 g/dL (ref 1.5–4.5)
Glucose: 96 mg/dL (ref 65–99)
Potassium: 3.8 mmol/L (ref 3.5–5.2)
Sodium: 144 mmol/L (ref 134–144)
Total Protein: 7 g/dL (ref 6.0–8.5)

## 2018-11-26 LAB — LIPID PANEL
Chol/HDL Ratio: 3.2 ratio (ref 0.0–4.4)
Cholesterol, Total: 192 mg/dL (ref 100–199)
HDL: 60 mg/dL (ref >39)
LDL CALC: 113 mg/dL — AB (ref 0–99)
Triglycerides: 95 mg/dL (ref 0–149)
VLDL CHOLESTEROL CAL: 19 mg/dL (ref 5–40)

## 2018-11-26 LAB — TSH: TSH: 1.04 u[IU]/mL (ref 0.450–4.500)

## 2019-04-15 DIAGNOSIS — H25013 Cortical age-related cataract, bilateral: Secondary | ICD-10-CM | POA: Diagnosis not present

## 2019-04-15 DIAGNOSIS — H524 Presbyopia: Secondary | ICD-10-CM | POA: Diagnosis not present

## 2019-05-28 ENCOUNTER — Ambulatory Visit (INDEPENDENT_AMBULATORY_CARE_PROVIDER_SITE_OTHER): Payer: Medicare HMO | Admitting: Family Medicine

## 2019-05-28 ENCOUNTER — Other Ambulatory Visit: Payer: Self-pay

## 2019-05-28 ENCOUNTER — Encounter: Payer: Self-pay | Admitting: Family Medicine

## 2019-05-28 DIAGNOSIS — E7800 Pure hypercholesterolemia, unspecified: Secondary | ICD-10-CM | POA: Diagnosis not present

## 2019-05-28 DIAGNOSIS — K219 Gastro-esophageal reflux disease without esophagitis: Secondary | ICD-10-CM | POA: Diagnosis not present

## 2019-05-28 DIAGNOSIS — E039 Hypothyroidism, unspecified: Secondary | ICD-10-CM

## 2019-05-28 DIAGNOSIS — I129 Hypertensive chronic kidney disease with stage 1 through stage 4 chronic kidney disease, or unspecified chronic kidney disease: Secondary | ICD-10-CM

## 2019-05-28 DIAGNOSIS — N183 Chronic kidney disease, stage 3 (moderate): Secondary | ICD-10-CM

## 2019-05-28 DIAGNOSIS — I10 Essential (primary) hypertension: Secondary | ICD-10-CM | POA: Diagnosis not present

## 2019-05-28 NOTE — Progress Notes (Signed)
There were no vitals taken for this visit.   Subjective:    Patient ID: Michelle Novak, female    DOB: 03/07/1932, 85 y.o.   MRN: [id number]  HPI: Michelle Novak is a 85 y.o. female  Med check Discussed with patient blood pressure medication doing well no complaints other medication reflux stable thyroid stable cholesterol stable.  With no issues taking medications without problems having just some stiffness of age but otherwise doing well.  Relevant past medical, surgical, family and social history reviewed and updated as indicated. Interim medical history since our last visit reviewed. Allergies and medications reviewed and updated.  Review of Systems  Constitutional: Negative.   Respiratory: Negative.   Cardiovascular: Negative.     Per HPI unless specifically indicated above     Objective:    There were no vitals taken for this visit.  Wt Readings from Last 3 Encounters:  11/25/18 141 lb (64 kg)  10/24/18 144 lb (65.3 kg)  05/15/18 143 lb (64.9 kg)    Physical Exam  Results for orders placed or performed in visit on 11/25/18  CBC with Differential/Platelet  Result Value Ref Range   WBC 7.3 3.4 - 10.8 x10E3/uL   RBC 3.83 3.77 - 5.28 x10E6/uL   Hemoglobin 10.8 (L) 11.1 - 15.9 g/dL   Hematocrit 34.1 34.0 - 46.6 %   MCV 89 79 - 97 fL   MCH 28.2 26.6 - 33.0 pg   MCHC 31.7 31.5 - 35.7 g/dL   RDW 13.8 11.7 - 15.4 %   Platelets 255 150 - 450 x10E3/uL   Neutrophils 43 Not Estab. %   Lymphs 49 Not Estab. %   Monocytes 6 Not Estab. %   Eos 1 Not Estab. %   Basos 1 Not Estab. %   Neutrophils Absolute 3.1 1.4 - 7.0 x10E3/uL   Lymphocytes Absolute 3.7 (H) 0.7 - 3.1 x10E3/uL   Monocytes Absolute 0.4 0.1 - 0.9 x10E3/uL   EOS (ABSOLUTE) 0.1 0.0 - 0.4 x10E3/uL   Basophils Absolute 0.0 0.0 - 0.2 x10E3/uL   Immature Granulocytes 0 Not Estab. %   Immature Grans (Abs) 0.0 0.0 - 0.1 x10E3/uL  Comprehensive metabolic panel  Result Value Ref Range   Glucose 96 65  - 99 mg/dL   BUN 23 8 - 27 mg/dL   Creatinine, Ser 1.59 (H) 0.57 - 1.00 mg/dL   GFR calc non Af Amer 30 (L) >59 mL/min/1.73   GFR calc Af Amer 34 (L) >59 mL/min/1.73   BUN/Creatinine Ratio 14 12 - 28   Sodium 144 134 - 144 mmol/L   Potassium 3.8 3.5 - 5.2 mmol/L   Chloride 105 96 - 106 mmol/L   CO2 22 20 - 29 mmol/L   Calcium 9.8 8.7 - 10.3 mg/dL   Total Protein 7.0 6.0 - 8.5 g/dL   Albumin 4.5 3.6 - 4.6 g/dL   Globulin, Total 2.5 1.5 - 4.5 g/dL   Albumin/Globulin Ratio 1.8 1.2 - 2.2   Bilirubin Total 0.4 0.0 - 1.2 mg/dL   Alkaline Phosphatase 76 39 - 117 IU/L   AST 18 0 - 40 IU/L   ALT 18 0 - 32 IU/L  Lipid panel  Result Value Ref Range   Cholesterol, Total 192 100 - 199 mg/dL   Triglycerides 95 0 - 149 mg/dL   HDL 60 >39 mg/dL   VLDL Cholesterol Cal 19 5 - 40 mg/dL   LDL Calculated 113 (H) 0 - 99 mg/dL   Chol/HDL Ratio  3.2 0.0 - 4.4 ratio  TSH  Result Value Ref Range   TSH 1.040 0.450 - 4.500 uIU/mL      Assessment & Plan:   Problem List Items Addressed This Visit      Cardiovascular and Mediastinum   Hypertension    The current medical regimen is effective;  continue present plan and medications.       Relevant Orders   Basic metabolic panel     Digestive   Esophageal reflux    The current medical regimen is effective;  continue present plan and medications.         Endocrine   Hypothyroidism    The current medical regimen is effective;  continue present plan and medications.         Genitourinary   Hypertensive kidney disease with CKD stage III ([place])    Discuss kidney disease and will check CBC and BMP to assess stability may consider nephrology consult.      Relevant Orders   Basic metabolic panel   CBC with Differential/Platelet     Other   Hyperlipidemia    The current medical regimen is effective;  continue present plan and medications.       Relevant Orders   LP+ALT+AST Piccolo, [employer]     Telemedicine using audio/video  telecommunications for a synchronous communication visit. Today's visit due to COVID-19 isolation precautions I connected with and verified that I am speaking with the correct person using two identifiers.   I discussed the limitations, risks, security and privacy concerns of performing an evaluation and management service by telecommunication and the availability of in person appointments. I also discussed with the patient that there may be a patient responsible charge related to this service. The patient expressed understanding and agreed to proceed. The patient's location is home. I am at home.   I discussed the assessment and treatment plan with the patient. The patient was provided an opportunity to ask questions and all were answered. The patient agreed with the plan and demonstrated an understanding of the instructions.   The patient was advised to call back or seek an in-person evaluation if the symptoms worsen or if the condition fails to improve as anticipated.   I provided 21+ minutes of time during this encounter.  Follow up plan: Return in about 6 months (around 11/28/2019) for Physical Exam.

## 2019-05-28 NOTE — Assessment & Plan Note (Signed)
The current medical regimen is effective;  continue present plan and medications.  

## 2019-05-28 NOTE — Assessment & Plan Note (Signed)
Discuss kidney disease and will check CBC and BMP to assess stability may consider nephrology consult.

## 2019-06-04 ENCOUNTER — Other Ambulatory Visit: Payer: Medicare HMO

## 2019-06-04 DIAGNOSIS — E7800 Pure hypercholesterolemia, unspecified: Secondary | ICD-10-CM | POA: Diagnosis not present

## 2019-06-04 DIAGNOSIS — I10 Essential (primary) hypertension: Secondary | ICD-10-CM | POA: Diagnosis not present

## 2019-06-04 DIAGNOSIS — N183 Chronic kidney disease, stage 3 (moderate): Secondary | ICD-10-CM | POA: Diagnosis not present

## 2019-06-04 DIAGNOSIS — I129 Hypertensive chronic kidney disease with stage 1 through stage 4 chronic kidney disease, or unspecified chronic kidney disease: Secondary | ICD-10-CM | POA: Diagnosis not present

## 2019-06-04 LAB — LP+ALT+AST PICCOLO, WAIVED
ALT (SGPT) Piccolo, Waived: 27 U/L (ref 10–47)
AST (SGOT) Piccolo, Waived: 25 U/L (ref 11–38)
Chol/HDL Ratio Piccolo,Waive: 3 mg/dL
Cholesterol Piccolo, Waived: 200 mg/dL — ABNORMAL HIGH (ref <200)
HDL Chol Piccolo, Waived: 66 mg/dL (ref >59)
LDL Chol Calc Piccolo Waived: 113 mg/dL — ABNORMAL HIGH (ref <100)
Triglycerides Piccolo,Waived: 106 mg/dL (ref <150)
VLDL Chol Calc Piccolo,Waive: 21 mg/dL (ref <30)

## 2019-06-05 LAB — BASIC METABOLIC PANEL
BUN/Creatinine Ratio: 16 (ref 12–28)
BUN: 24 mg/dL (ref 8–27)
CO2: 22 mmol/L (ref 20–29)
Calcium: 9.6 mg/dL (ref 8.7–10.3)
Chloride: 103 mmol/L (ref 96–106)
Creatinine, Ser: 1.49 mg/dL — ABNORMAL HIGH (ref 0.57–1.00)
GFR calc Af Amer: 37 mL/min/{1.73_m2} — ABNORMAL LOW (ref >59)
GFR calc non Af Amer: 32 mL/min/{1.73_m2} — ABNORMAL LOW (ref >59)
Glucose: 97 mg/dL (ref 65–99)
Potassium: 4 mmol/L (ref 3.5–5.2)
Sodium: 141 mmol/L (ref 134–144)

## 2019-06-05 LAB — CBC WITH DIFFERENTIAL/PLATELET
Basophils Absolute: 0.1 10*3/uL (ref 0.0–0.2)
Basos: 1 %
EOS (ABSOLUTE): 0.1 10*3/uL (ref 0.0–0.4)
Eos: 1 %
Hematocrit: 34.2 % (ref 34.0–46.6)
Hemoglobin: 11.2 g/dL (ref 11.1–15.9)
Immature Grans (Abs): 0 10*3/uL (ref 0.0–0.1)
Immature Granulocytes: 0 %
Lymphocytes Absolute: 4.1 10*3/uL — ABNORMAL HIGH (ref 0.7–3.1)
Lymphs: 50 %
MCH: 28.2 pg (ref 26.6–33.0)
MCHC: 32.7 g/dL (ref 31.5–35.7)
MCV: 86 fL (ref 79–97)
Monocytes Absolute: 0.4 10*3/uL (ref 0.1–0.9)
Monocytes: 5 %
Neutrophils Absolute: 3.6 10*3/uL (ref 1.4–7.0)
Neutrophils: 43 %
Platelets: 258 10*3/uL (ref 150–450)
RBC: 3.97 x10E6/uL (ref 3.77–5.28)
RDW: 13.7 % (ref 11.7–15.4)
WBC: 8.3 10*3/uL (ref 3.4–10.8)

## 2019-10-27 ENCOUNTER — Ambulatory Visit: Payer: Medicare HMO

## 2019-11-27 ENCOUNTER — Ambulatory Visit (INDEPENDENT_AMBULATORY_CARE_PROVIDER_SITE_OTHER): Payer: Medicare HMO | Admitting: Family Medicine

## 2019-11-27 ENCOUNTER — Encounter: Payer: Self-pay | Admitting: Family Medicine

## 2019-11-27 ENCOUNTER — Other Ambulatory Visit: Payer: Self-pay

## 2019-11-27 VITALS — BP 134/86 | HR 84 | Temp 98.2°F | Ht 61.8 in | Wt 131.0 lb

## 2019-11-27 DIAGNOSIS — E039 Hypothyroidism, unspecified: Secondary | ICD-10-CM | POA: Diagnosis not present

## 2019-11-27 DIAGNOSIS — I129 Hypertensive chronic kidney disease with stage 1 through stage 4 chronic kidney disease, or unspecified chronic kidney disease: Secondary | ICD-10-CM

## 2019-11-27 DIAGNOSIS — Z0000 Encounter for general adult medical examination without abnormal findings: Secondary | ICD-10-CM | POA: Diagnosis not present

## 2019-11-27 DIAGNOSIS — M810 Age-related osteoporosis without current pathological fracture: Secondary | ICD-10-CM

## 2019-11-27 DIAGNOSIS — K219 Gastro-esophageal reflux disease without esophagitis: Secondary | ICD-10-CM | POA: Diagnosis not present

## 2019-11-27 DIAGNOSIS — E7800 Pure hypercholesterolemia, unspecified: Secondary | ICD-10-CM | POA: Diagnosis not present

## 2019-11-27 DIAGNOSIS — R8281 Pyuria: Secondary | ICD-10-CM | POA: Diagnosis not present

## 2019-11-27 DIAGNOSIS — N183 Chronic kidney disease, stage 3 unspecified: Secondary | ICD-10-CM

## 2019-11-27 DIAGNOSIS — R8271 Bacteriuria: Secondary | ICD-10-CM | POA: Diagnosis not present

## 2019-11-27 NOTE — Progress Notes (Signed)
BP 134/86   Pulse 84   Temp 98.2 F (36.8 C) (Oral)   Ht 5' 1.8" (1.57 m)   Wt 131 lb (59.4 kg)   SpO2 98%   BMI 24.12 kg/m    Subjective:    Patient ID: Michelle Novak, female    DOB: 01-03-1932, 86 y.o.   MRN: [id number]  HPI: Michelle Novak is a 86 y.o. female presenting on 11/27/2019 for comprehensive medical examination. Current medical complaints include:see below  Doing well, no new concerns today other than wanting to ask if she should receive a COVID vaccine.  HTN and CKD - Does not regularly check home BPs but stable and WNL when checked at appts. Taking her medications faithfully without side effects. Denies CP, SOB, HAs, dizziness.   HLD - on lipitor, tolerating well and watching diet. Does not exercise regularly but tries to move around at home as much as she can. Denies myalgias, claudication  Hypothyroid - taking synthroid, asymptomatic  GERD - tolerating prilosec well without breakthrough sxs. No melena, N/V/D.    She currently lives with: Menopausal Symptoms: no  Depression Screen done today and results listed below:  Depression screen [facility] 2/9 11/27/2019 11/25/2018 10/24/2018 05/15/2018 11/15/2017  Decreased Interest 0 0 0 0 0  Down, Depressed, Hopeless 0 0 0 0 0  PHQ - 2 Score 0 0 0 0 0  Altered sleeping 0 0 - - -  Tired, decreased energy 1 0 - - -  Change in appetite 0 0 - - -  Feeling bad or failure about yourself  0 0 - - -  Trouble concentrating 0 0 - - -  Moving slowly or fidgety/restless 0 0 - - -  Suicidal thoughts 0 0 - - -  PHQ-9 Score 1 0 - - -  Difficult doing work/chores - - - - -    The patient does not have a history of falls. I did complete a risk assessment for falls. A plan of care for falls was documented.   Past Medical History:  Past Medical History:  Diagnosis Date  . Arthritis   . Chronic kidney disease   . Esophageal reflux   . Heart murmur   . History of abnormal cervical Pap smear   . Hyperlipidemia   .  Hypertension   . Hypothyroidism   . Osteoporosis     Surgical History:  Past Surgical History:  Procedure Laterality Date  . ABDOMINAL HYSTERECTOMY    . JOINT REPLACEMENT      Medications:  Current Outpatient Medications on File Prior to Visit  Medication Sig  . aspirin EC 81 MG tablet Take 81 mg by mouth daily.  Marland Kitchen atorvastatin (LIPITOR) 10 MG tablet Take 1 tablet (10 mg total) by mouth at bedtime.  . benazepril (LOTENSIN) 10 MG tablet Take 1 tablet (10 mg total) by mouth daily.  . hydrochlorothiazide (HYDRODIURIL) 25 MG tablet Take 1 tablet (25 mg total) by mouth daily.  Marland Kitchen levothyroxine (SYNTHROID, LEVOTHROID) 50 MCG tablet Take 1 tablet (50 mcg total) by mouth daily before breakfast.  . metoprolol succinate (TOPROL-XL) 50 MG 24 hr tablet Take 1 tablet (50 mg total) by mouth daily. Take with or immediately following a meal.  . omeprazole (PRILOSEC) 20 MG capsule Take 1 capsule (20 mg total) by mouth daily.   No current facility-administered medications on file prior to visit.    Allergies:  No Known Allergies  Social History:  Social History   Socioeconomic History  .  Marital status: Widowed    Spouse name: Not on file  . Number of children: Not on file  . Years of education: 68  . Highest education level: 12th grade  Occupational History  . Not on file  Tobacco Use  . Smoking status: Never Smoker  . Smokeless tobacco: Never Used  Substance and Sexual Activity  . Alcohol use: No  . Drug use: No  . Sexual activity: Not on file  Other Topics Concern  . Not on file  Social History Narrative  . Not on file   Social Determinants of Health   Financial Resource Strain:   . Difficulty of Paying Living Expenses: Not on file  Food Insecurity:   . Worried About Charity fundraiser in the Last Year: Not on file  . Ran Out of Food in the Last Year: Not on file  Transportation Needs:   . Lack of Transportation (Medical): Not on file  . Lack of Transportation  (Non-Medical): Not on file  Physical Activity:   . Days of Exercise per Week: Not on file  . Minutes of Exercise per Session: Not on file  Stress:   . Feeling of Stress : Not on file  Social Connections:   . Frequency of Communication with Friends and Family: Not on file  . Frequency of Social Gatherings with Friends and Family: Not on file  . Attends Religious Services: Not on file  . Active Member of Clubs or Organizations: Not on file  . Attends Archivist Meetings: Not on file  . Marital Status: Not on file  Intimate Partner Violence:   . Fear of Current or Ex-Partner: Not on file  . Emotionally Abused: Not on file  . Physically Abused: Not on file  . Sexually Abused: Not on file   Social History   Tobacco Use  Smoking Status Never Smoker  Smokeless Tobacco Never Used   Social History   Substance and Sexual Activity  Alcohol Use No    Family History:  Family History  Problem Relation Age of Onset  . Heart disease Mother   . Heart attack Mother   . Cancer Father        bone  . Heart disease Sister     Past medical history, surgical history, medications, allergies, family history and social history reviewed with patient today and changes made to appropriate areas of the chart.   Review of Systems - General ROS: negative Psychological ROS: negative Ophthalmic ROS: negative ENT ROS: negative Allergy and Immunology ROS: negative Hematological and Lymphatic ROS: negative Endocrine ROS: negative Breast ROS: negative for breast lumps Respiratory ROS: no cough, shortness of breath, or wheezing Cardiovascular ROS: no chest pain or dyspnea on exertion Gastrointestinal ROS: no abdominal pain, change in bowel habits, or black or bloody stools Genito-Urinary ROS: no dysuria, trouble voiding, or hematuria Musculoskeletal ROS: negative Neurological ROS: no TIA or stroke symptoms Dermatological ROS: negative All other ROS negative except what is listed above and  in the HPI.      Objective:    BP 134/86   Pulse 84   Temp 98.2 F (36.8 C) (Oral)   Ht 5' 1.8" (1.57 m)   Wt 131 lb (59.4 kg)   SpO2 98%   BMI 24.12 kg/m   Wt Readings from Last 3 Encounters:  11/27/19 131 lb (59.4 kg)  11/25/18 141 lb (64 kg)  10/24/18 144 lb (65.3 kg)    Physical Exam Vitals and nursing note reviewed.  Constitutional:      General: She is not in acute distress.    Appearance: She is well-developed.  HENT:     Head: Atraumatic.     Right Ear: External ear normal.     Left Ear: External ear normal.     Nose: Nose normal.     Mouth/Throat:     Pharynx: No oropharyngeal exudate.  Eyes:     General: No scleral icterus.    Conjunctiva/sclera: Conjunctivae normal.     Pupils: Pupils are equal, round, and reactive to light.  Neck:     Thyroid: No thyromegaly.  Cardiovascular:     Rate and Rhythm: Normal rate and regular rhythm.     Heart sounds: Normal heart sounds.  Pulmonary:     Effort: Pulmonary effort is normal. No respiratory distress.     Breath sounds: Normal breath sounds.  Abdominal:     General: Bowel sounds are normal.     Palpations: Abdomen is soft. There is no mass.     Tenderness: There is no abdominal tenderness.  Musculoskeletal:        General: No tenderness. Normal range of motion.     Cervical back: Normal range of motion and neck supple.  Lymphadenopathy:     Cervical: No cervical adenopathy.  Skin:    General: Skin is warm and dry.     Findings: No rash.  Neurological:     Mental Status: She is alert and oriented to person, place, and time.     Cranial Nerves: No cranial nerve deficit.  Psychiatric:        Behavior: Behavior normal.     Results for orders placed or performed in visit on 11/27/19  Microscopic Examination   URINE  Result Value Ref Range   WBC, UA 11-30 (A) 0 - 5 /hpf   RBC 0-2 0 - 2 /hpf   Epithelial Cells (non renal) 0-10 0 - 10 /hpf   Bacteria, UA Many (A) None seen/Few  Urine Culture, Reflex    URINE  Result Value Ref Range   Urine Culture, Routine Final report (A)    Organism ID, Bacteria Escherichia coli (A)    Antimicrobial Susceptibility Comment   CBC with Differential/Platelet  Result Value Ref Range   WBC 10.2 3.4 - 10.8 x10E3/uL   RBC 3.93 3.77 - 5.28 x10E6/uL   Hemoglobin 11.4 11.1 - 15.9 g/dL   Hematocrit 35.2 34.0 - 46.6 %   MCV 90 79 - 97 fL   MCH 29.0 26.6 - 33.0 pg   MCHC 32.4 31.5 - 35.7 g/dL   RDW 13.8 11.7 - 15.4 %   Platelets 274 150 - 450 x10E3/uL   Neutrophils 43 Not Estab. %   Lymphs 48 Not Estab. %   Monocytes 6 Not Estab. %   Eos 1 Not Estab. %   Basos 1 Not Estab. %   Neutrophils Absolute 4.4 1.4 - 7.0 x10E3/uL   Lymphocytes Absolute 5.0 (H) 0.7 - 3.1 x10E3/uL   Monocytes Absolute 0.6 0.1 - 0.9 x10E3/uL   EOS (ABSOLUTE) 0.1 0.0 - 0.4 x10E3/uL   Basophils Absolute 0.1 0.0 - 0.2 x10E3/uL   Immature Granulocytes 1 Not Estab. %   Immature Grans (Abs) 0.1 0.0 - 0.1 x10E3/uL  Comprehensive metabolic panel  Result Value Ref Range   Glucose 89 65 - 99 mg/dL   BUN 40 (H) 8 - 27 mg/dL   Creatinine, Ser 1.79 (H) 0.57 - 1.00 mg/dL   GFR calc non  Af Amer 25 (L) >59 mL/min/1.73   GFR calc Af Amer 29 (L) >59 mL/min/1.73   BUN/Creatinine Ratio 22 12 - 28   Sodium 141 134 - 144 mmol/L   Potassium 4.1 3.5 - 5.2 mmol/L   Chloride 102 96 - 106 mmol/L   CO2 23 20 - 29 mmol/L   Calcium 10.1 8.7 - 10.3 mg/dL   Total Protein 7.2 6.0 - 8.5 g/dL   Albumin 4.8 (H) 3.6 - 4.6 g/dL   Globulin, Total 2.4 1.5 - 4.5 g/dL   Albumin/Globulin Ratio 2.0 1.2 - 2.2   Bilirubin Total 0.2 0.0 - 1.2 mg/dL   Alkaline Phosphatase 90 39 - 117 IU/L   AST 20 0 - 40 IU/L   ALT 16 0 - 32 IU/L  Lipid Panel w/o Chol/HDL Ratio  Result Value Ref Range   Cholesterol, Total 220 (H) 100 - 199 mg/dL   Triglycerides 228 (H) 0 - 149 mg/dL   HDL 52 >39 mg/dL   VLDL Cholesterol Cal 40 5 - 40 mg/dL   LDL Chol Calc (NIH) 128 (H) 0 - 99 mg/dL  UA/M w/rflx Culture, Routine   Specimen: Urine     URINE  Result Value Ref Range   Specific Gravity, UA 1.015 1.005 - 1.030   pH, UA 5.0 5.0 - 7.5   Color, UA Yellow Yellow   Appearance Ur Hazy (A) Clear   Leukocytes,UA 1+ (A) Negative   Protein,UA Negative Negative/Trace   Glucose, UA Negative Negative   Ketones, UA Trace (A) Negative   RBC, UA Trace (A) Negative   Bilirubin, UA Negative Negative   Urobilinogen, Ur 0.2 0.2 - 1.0 mg/dL   Nitrite, UA Negative Negative   Microscopic Examination See below:    Urinalysis Reflex Comment   TSH  Result Value Ref Range   TSH 0.757 0.450 - 4.500 uIU/mL      Assessment & Plan:   Problem List Items Addressed This Visit      Digestive   Esophageal reflux    Stable on prilosec, continue current regimen        Endocrine   Hypothyroidism    Recheck TSH, adjust as needed. Continue current regimen      Relevant Orders   TSH (Completed)     Musculoskeletal and Integument   Osteoporosis    OTC vitamins, weight bearing exercises        Genitourinary   Hypertensive kidney disease with CKD stage III    BPs stable and under good control, continue current regimen      Relevant Orders   CBC with Differential/Platelet (Completed)   UA/M w/rflx Culture, Routine (Completed)     Other   Hyperlipidemia    Recheck lipids, continue working on lifestyle habits. Continue present medicatinos      Relevant Orders   Comprehensive metabolic panel (Completed)   Lipid Panel w/o Chol/HDL Ratio (Completed)    Other Visit Diagnoses    Annual physical exam    -  Primary       Follow up plan: Return in about 6 months (around 05/26/2020) for 6 month f/u.   LABORATORY TESTING:  - Pap smear: not applicable  IMMUNIZATIONS:   - Tdap: Tetanus vaccination status reviewed: due, but not covered by insurance unless recent injury. Will postpone for now until indicated and covered by insurance. - Influenza: Up to date - Pneumovax: Up to date - Prevnar: Up to date - HPV: Not applicable -  Zostavax vaccine: Up to date  SCREENING: -Mammogram: Not applicable  - Colonoscopy: Not applicable  - Bone Density: Up to date   PATIENT COUNSELING:   Advised to take 1 mg of folate supplement per day if capable of pregnancy.   Sexuality: Discussed sexually transmitted diseases, partner selection, use of condoms, avoidance of unintended pregnancy  and contraceptive alternatives.   Advised to avoid cigarette smoking.  I discussed with the patient that most people either abstain from alcohol or drink within safe limits (<=14/week and <=4 drinks/occasion for males, <=7/weeks and <= 3 drinks/occasion for females) and that the risk for alcohol disorders and other health effects rises proportionally with the number of drinks per week and how often a drinker exceeds daily limits.  Discussed cessation/primary prevention of drug use and availability of treatment for abuse.   Diet: Encouraged to adjust caloric intake to maintain  or achieve ideal body weight, to reduce intake of dietary saturated fat and total fat, to limit sodium intake by avoiding high sodium foods and not adding table salt, and to maintain adequate dietary potassium and calcium preferably from fresh fruits, vegetables, and low-fat dairy products.    stressed the importance of regular exercise  Injury prevention: Discussed safety belts, safety helmets, smoke detector, smoking near bedding or upholstery.   Dental health: Discussed importance of regular tooth brushing, flossing, and dental visits.    NEXT PREVENTATIVE PHYSICAL DUE IN 1 YEAR. Return in about 6 months (around 05/26/2020) for 6 month f/u.

## 2019-11-28 LAB — COMPREHENSIVE METABOLIC PANEL
ALT: 16 IU/L (ref 0–32)
AST: 20 IU/L (ref 0–40)
Albumin/Globulin Ratio: 2 (ref 1.2–2.2)
Albumin: 4.8 g/dL — ABNORMAL HIGH (ref 3.6–4.6)
Alkaline Phosphatase: 90 IU/L (ref 39–117)
BUN/Creatinine Ratio: 22 (ref 12–28)
BUN: 40 mg/dL — ABNORMAL HIGH (ref 8–27)
Bilirubin Total: 0.2 mg/dL (ref 0.0–1.2)
CO2: 23 mmol/L (ref 20–29)
Calcium: 10.1 mg/dL (ref 8.7–10.3)
Chloride: 102 mmol/L (ref 96–106)
Creatinine, Ser: 1.79 mg/dL — ABNORMAL HIGH (ref 0.57–1.00)
GFR calc Af Amer: 29 mL/min/{1.73_m2} — ABNORMAL LOW (ref >59)
GFR calc non Af Amer: 25 mL/min/{1.73_m2} — ABNORMAL LOW (ref >59)
Globulin, Total: 2.4 g/dL (ref 1.5–4.5)
Glucose: 89 mg/dL (ref 65–99)
Potassium: 4.1 mmol/L (ref 3.5–5.2)
Sodium: 141 mmol/L (ref 134–144)
Total Protein: 7.2 g/dL (ref 6.0–8.5)

## 2019-11-28 LAB — CBC WITH DIFFERENTIAL/PLATELET
Basophils Absolute: 0.1 10*3/uL (ref 0.0–0.2)
Basos: 1 %
EOS (ABSOLUTE): 0.1 10*3/uL (ref 0.0–0.4)
Eos: 1 %
Hematocrit: 35.2 % (ref 34.0–46.6)
Hemoglobin: 11.4 g/dL (ref 11.1–15.9)
Immature Grans (Abs): 0.1 10*3/uL (ref 0.0–0.1)
Immature Granulocytes: 1 %
Lymphocytes Absolute: 5 10*3/uL — ABNORMAL HIGH (ref 0.7–3.1)
Lymphs: 48 %
MCH: 29 pg (ref 26.6–33.0)
MCHC: 32.4 g/dL (ref 31.5–35.7)
MCV: 90 fL (ref 79–97)
Monocytes Absolute: 0.6 10*3/uL (ref 0.1–0.9)
Monocytes: 6 %
Neutrophils Absolute: 4.4 10*3/uL (ref 1.4–7.0)
Neutrophils: 43 %
Platelets: 274 10*3/uL (ref 150–450)
RBC: 3.93 x10E6/uL (ref 3.77–5.28)
RDW: 13.8 % (ref 11.7–15.4)
WBC: 10.2 10*3/uL (ref 3.4–10.8)

## 2019-11-28 LAB — LIPID PANEL W/O CHOL/HDL RATIO
Cholesterol, Total: 220 mg/dL — ABNORMAL HIGH (ref 100–199)
HDL: 52 mg/dL (ref >39)
LDL Chol Calc (NIH): 128 mg/dL — ABNORMAL HIGH (ref 0–99)
Triglycerides: 228 mg/dL — ABNORMAL HIGH (ref 0–149)
VLDL Cholesterol Cal: 40 mg/dL (ref 5–40)

## 2019-11-28 LAB — TSH: TSH: 0.757 u[IU]/mL (ref 0.450–4.500)

## 2019-11-30 LAB — UA/M W/RFLX CULTURE, ROUTINE
Bilirubin, UA: NEGATIVE
Glucose, UA: NEGATIVE
Nitrite, UA: NEGATIVE
Protein,UA: NEGATIVE
Specific Gravity, UA: 1.015 (ref 1.005–1.030)
Urobilinogen, Ur: 0.2 mg/dL (ref 0.2–1.0)
pH, UA: 5 (ref 5.0–7.5)

## 2019-11-30 LAB — URINE CULTURE, REFLEX

## 2019-11-30 LAB — MICROSCOPIC EXAMINATION

## 2019-12-01 ENCOUNTER — Encounter: Payer: Self-pay | Admitting: Family Medicine

## 2019-12-02 NOTE — Assessment & Plan Note (Signed)
Stable on prilosec, continue current regimen

## 2019-12-02 NOTE — Assessment & Plan Note (Signed)
OTC vitamins, weight bearing exercises

## 2019-12-02 NOTE — Assessment & Plan Note (Signed)
Recheck TSH, adjust as needed. Continue current regimen

## 2019-12-02 NOTE — Assessment & Plan Note (Signed)
Recheck lipids, continue working on lifestyle habits. Continue present medicatinos

## 2019-12-02 NOTE — Assessment & Plan Note (Signed)
BPs stable and under good control, continue current regimen 

## 2019-12-03 ENCOUNTER — Encounter: Payer: Self-pay | Admitting: Family Medicine

## 2020-01-15 ENCOUNTER — Other Ambulatory Visit: Payer: Self-pay | Admitting: Family Medicine

## 2020-01-15 DIAGNOSIS — I10 Essential (primary) hypertension: Secondary | ICD-10-CM

## 2020-01-15 MED ORDER — METOPROLOL SUCCINATE ER 50 MG PO TB24: 50.0000 mg | ORAL_TABLET | Freq: Every day | ORAL | 1 refills | Status: DC

## 2020-01-15 NOTE — Telephone Encounter (Signed)
Medication Refill - Medication: Metoprolol 50 mg  Has the patient contacted their pharmacy? Yes.   (Agent: If no, request that the patient contact the pharmacy for the refill.) (Agent: If yes, when and what did the pharmacy advise?)  Preferred Pharmacy (with phone number or street name): [place]: Please be advised that RX refills may take up to 3 business days. We ask that you follow-up with your pharmacy.

## 2020-02-04 ENCOUNTER — Other Ambulatory Visit: Payer: Self-pay

## 2020-02-04 DIAGNOSIS — E7800 Pure hypercholesterolemia, unspecified: Secondary | ICD-10-CM

## 2020-02-04 MED ORDER — ATORVASTATIN CALCIUM 10 MG PO TABS: 10.0000 mg | ORAL_TABLET | Freq: Every day | ORAL | 1 refills | Status: DC

## 2020-02-04 NOTE — Telephone Encounter (Signed)
LOV: 11/27/2019 with Merrie Roof, PA

## 2020-02-16 ENCOUNTER — Other Ambulatory Visit: Payer: Self-pay | Admitting: Family Medicine

## 2020-02-16 DIAGNOSIS — E039 Hypothyroidism, unspecified: Secondary | ICD-10-CM

## 2020-02-16 DIAGNOSIS — I10 Essential (primary) hypertension: Secondary | ICD-10-CM

## 2020-02-16 DIAGNOSIS — K219 Gastro-esophageal reflux disease without esophagitis: Secondary | ICD-10-CM

## 2020-02-16 MED ORDER — HYDROCHLOROTHIAZIDE 25 MG PO TABS: 25.0000 mg | ORAL_TABLET | Freq: Every day | ORAL | 0 refills | Status: DC

## 2020-02-16 MED ORDER — OMEPRAZOLE 20 MG PO CPDR: 20.0000 mg | DELAYED_RELEASE_CAPSULE | Freq: Every day | ORAL | 0 refills | Status: DC

## 2020-02-16 MED ORDER — LEVOTHYROXINE SODIUM 50 MCG PO TABS: 50.0000 ug | ORAL_TABLET | Freq: Every day | ORAL | 0 refills | Status: DC

## 2020-02-16 NOTE — Telephone Encounter (Signed)
LOV: 11/27/2019

## 2020-02-16 NOTE — Telephone Encounter (Signed)
Pt is here in office stating she needs a refill on medications, hydrochlorothiazide, levothyroxine, and omeprazol.

## 2020-02-18 ENCOUNTER — Other Ambulatory Visit: Payer: Self-pay

## 2020-02-18 ENCOUNTER — Other Ambulatory Visit: Payer: Self-pay | Admitting: Family Medicine

## 2020-02-18 DIAGNOSIS — K219 Gastro-esophageal reflux disease without esophagitis: Secondary | ICD-10-CM

## 2020-02-18 DIAGNOSIS — E039 Hypothyroidism, unspecified: Secondary | ICD-10-CM

## 2020-02-18 DIAGNOSIS — I10 Essential (primary) hypertension: Secondary | ICD-10-CM

## 2020-02-18 MED ORDER — LEVOTHYROXINE SODIUM 50 MCG PO TABS: 50.0000 ug | ORAL_TABLET | Freq: Every day | ORAL | 0 refills | Status: DC

## 2020-02-18 MED ORDER — OMEPRAZOLE 20 MG PO CPDR: 20.0000 mg | DELAYED_RELEASE_CAPSULE | Freq: Every day | ORAL | 3 refills | Status: DC

## 2020-02-18 MED ORDER — HYDROCHLOROTHIAZIDE 25 MG PO TABS: 25.0000 mg | ORAL_TABLET | Freq: Every day | ORAL | 0 refills | Status: DC

## 2020-02-18 NOTE — Telephone Encounter (Signed)
Omeprazole was sent to [facility] on 02/18/2020, All others were sent to [facility].    Copied from [place] [phone]. Topic: General - Other >> Feb 18, 2020  9:12 AM Rainey Pines A wrote: Patient stated that all medication were to be sent to the correct pharmacy. Patient stated that medications were sent to the incorrect pharmacy.  [place], [place]  Phone:  [phone] Fax:  [phone]

## 2020-02-18 NOTE — Telephone Encounter (Signed)
Medication Refill - Medication: hydrochlorothiazide (HYDRODIURIL) 25 MG tablet levothyroxine (SYNTHROID) 50 MCG tablet    Tonya called from [facility] and stated that patient needs her Rx sent there  Has the patient contacted their pharmacy? Yes.   (Agent: If no, request that the patient contact the pharmacy for the refill.) (Agent: If yes, when and what did the pharmacy advise?)  Preferred Pharmacy (with phone number or street name):  [place], [place] - [address]  [address] [place] [address]  Phone: [phone] Fax: [phone]     Agent: Please be advised that RX refills may take up to 3 business days. We ask that you follow-up with your pharmacy.

## 2020-03-08 DIAGNOSIS — Z1159 Encounter for screening for other viral diseases: Secondary | ICD-10-CM | POA: Diagnosis not present

## 2020-03-08 DIAGNOSIS — R944 Abnormal results of kidney function studies: Secondary | ICD-10-CM | POA: Diagnosis not present

## 2020-03-08 DIAGNOSIS — I10 Essential (primary) hypertension: Secondary | ICD-10-CM | POA: Diagnosis not present

## 2020-03-08 DIAGNOSIS — E039 Hypothyroidism, unspecified: Secondary | ICD-10-CM | POA: Diagnosis not present

## 2020-03-08 DIAGNOSIS — Z0000 Encounter for general adult medical examination without abnormal findings: Secondary | ICD-10-CM | POA: Diagnosis not present

## 2020-03-08 DIAGNOSIS — F17229 Nicotine dependence, chewing tobacco, with unspecified nicotine-induced disorders: Secondary | ICD-10-CM | POA: Diagnosis not present

## 2020-03-08 DIAGNOSIS — Z1389 Encounter for screening for other disorder: Secondary | ICD-10-CM | POA: Diagnosis not present

## 2020-03-08 DIAGNOSIS — E785 Hyperlipidemia, unspecified: Secondary | ICD-10-CM | POA: Diagnosis not present

## 2020-03-08 DIAGNOSIS — Z114 Encounter for screening for human immunodeficiency virus [HIV]: Secondary | ICD-10-CM | POA: Diagnosis not present

## 2020-03-08 DIAGNOSIS — K219 Gastro-esophageal reflux disease without esophagitis: Secondary | ICD-10-CM | POA: Diagnosis not present

## 2020-03-15 DIAGNOSIS — F17229 Nicotine dependence, chewing tobacco, with unspecified nicotine-induced disorders: Secondary | ICD-10-CM | POA: Diagnosis not present

## 2020-03-15 DIAGNOSIS — R944 Abnormal results of kidney function studies: Secondary | ICD-10-CM | POA: Diagnosis not present

## 2020-03-15 DIAGNOSIS — Z1389 Encounter for screening for other disorder: Secondary | ICD-10-CM | POA: Diagnosis not present

## 2020-04-29 ENCOUNTER — Other Ambulatory Visit: Payer: Self-pay | Admitting: Family Medicine

## 2020-04-29 DIAGNOSIS — I10 Essential (primary) hypertension: Secondary | ICD-10-CM

## 2020-04-29 NOTE — Telephone Encounter (Signed)
Requested Prescriptions  Pending Prescriptions Disp Refills   metoprolol succinate (TOPROL-XL) 50 MG 24 hr tablet [Pharmacy Med Name: METOPROLOL SUCC ER 50 MG TAB] 90 tablet 0    Sig: TAKE 1 TABLET BY MOUTH ONCE DAILY. TAKE WITH OR IMMEDIATELY FOLLOWING A MEAL.     Cardiovascular:  Beta Blockers Passed - 04/29/2020 12:10 PM      Passed - Last BP in normal range    BP Readings from Last 1 Encounters:  11/27/19 134/86         Passed - Last Heart Rate in normal range    Pulse Readings from Last 1 Encounters:  11/27/19 84         Passed - Valid encounter within last 6 months    Recent Outpatient Visits          5 months ago Annual physical exam   [facility] Volney American, [place]   11 months ago Essential hypertension   [facility] [place], Jeannette How, MD   1 year ago Essential hypertension   [place], Jeannette How, MD   1 year ago Essential hypertension   [place], Jeannette How, [place]   2 years ago Essential hypertension   [facility] [place], Jeannette How, MD

## 2020-07-15 DIAGNOSIS — Z0100 Encounter for examination of eyes and vision without abnormal findings: Secondary | ICD-10-CM | POA: Diagnosis not present

## 2020-07-15 DIAGNOSIS — H524 Presbyopia: Secondary | ICD-10-CM | POA: Diagnosis not present

## 2020-07-19 ENCOUNTER — Telehealth: Payer: Self-pay | Admitting: Family Medicine

## 2020-07-19 NOTE — Telephone Encounter (Signed)
Patient declined the Medicare Wellness Visit with [facility] due to changing providers  She stated she is now seeing Dr Ezequiel Ganser

## 2020-07-23 DIAGNOSIS — N184 Chronic kidney disease, stage 4 (severe): Secondary | ICD-10-CM | POA: Diagnosis not present

## 2020-07-23 DIAGNOSIS — E039 Hypothyroidism, unspecified: Secondary | ICD-10-CM | POA: Diagnosis not present

## 2020-07-23 DIAGNOSIS — F17229 Nicotine dependence, chewing tobacco, with unspecified nicotine-induced disorders: Secondary | ICD-10-CM | POA: Diagnosis not present

## 2020-07-23 DIAGNOSIS — F4320 Adjustment disorder, unspecified: Secondary | ICD-10-CM | POA: Diagnosis not present

## 2020-07-23 DIAGNOSIS — I10 Essential (primary) hypertension: Secondary | ICD-10-CM | POA: Diagnosis not present

## 2020-07-23 DIAGNOSIS — Z1389 Encounter for screening for other disorder: Secondary | ICD-10-CM | POA: Diagnosis not present

## 2020-07-23 DIAGNOSIS — R944 Abnormal results of kidney function studies: Secondary | ICD-10-CM | POA: Diagnosis not present

## 2020-07-26 DIAGNOSIS — F4320 Adjustment disorder, unspecified: Secondary | ICD-10-CM | POA: Diagnosis not present

## 2020-08-17 DIAGNOSIS — F4320 Adjustment disorder, unspecified: Secondary | ICD-10-CM | POA: Diagnosis not present

## 2020-08-17 DIAGNOSIS — Z1389 Encounter for screening for other disorder: Secondary | ICD-10-CM | POA: Diagnosis not present

## 2020-09-17 DIAGNOSIS — I10 Essential (primary) hypertension: Secondary | ICD-10-CM | POA: Diagnosis not present

## 2020-09-17 DIAGNOSIS — N184 Chronic kidney disease, stage 4 (severe): Secondary | ICD-10-CM | POA: Diagnosis not present

## 2020-09-17 HISTORY — DX: Essential (primary) hypertension: I10

## 2020-09-21 DIAGNOSIS — Z0000 Encounter for general adult medical examination without abnormal findings: Secondary | ICD-10-CM | POA: Diagnosis not present

## 2020-09-21 DIAGNOSIS — F4320 Adjustment disorder, unspecified: Secondary | ICD-10-CM | POA: Diagnosis not present

## 2020-09-21 DIAGNOSIS — Z23 Encounter for immunization: Secondary | ICD-10-CM | POA: Diagnosis not present

## 2020-09-21 DIAGNOSIS — E039 Hypothyroidism, unspecified: Secondary | ICD-10-CM | POA: Diagnosis not present

## 2020-09-21 DIAGNOSIS — N184 Chronic kidney disease, stage 4 (severe): Secondary | ICD-10-CM | POA: Diagnosis not present

## 2020-09-21 DIAGNOSIS — D649 Anemia, unspecified: Secondary | ICD-10-CM | POA: Diagnosis not present

## 2020-09-21 DIAGNOSIS — I10 Essential (primary) hypertension: Secondary | ICD-10-CM | POA: Diagnosis not present

## 2021-02-14 DIAGNOSIS — N1832 Chronic kidney disease, stage 3b: Secondary | ICD-10-CM | POA: Insufficient documentation

## 2021-12-30 ENCOUNTER — Ambulatory Visit: Payer: Medicare HMO | Admitting: Internal Medicine

## 2022-01-04 ENCOUNTER — Other Ambulatory Visit: Payer: Self-pay

## 2022-01-04 ENCOUNTER — Encounter: Payer: Self-pay | Admitting: Internal Medicine

## 2022-01-04 ENCOUNTER — Ambulatory Visit (INDEPENDENT_AMBULATORY_CARE_PROVIDER_SITE_OTHER): Payer: Medicare HMO | Admitting: Internal Medicine

## 2022-01-04 VITALS — BP 136/82 | HR 111 | Ht 61.8 in | Wt 113.0 lb

## 2022-01-04 DIAGNOSIS — E039 Hypothyroidism, unspecified: Secondary | ICD-10-CM

## 2022-01-04 DIAGNOSIS — K219 Gastro-esophageal reflux disease without esophagitis: Secondary | ICD-10-CM

## 2022-01-04 DIAGNOSIS — E7800 Pure hypercholesterolemia, unspecified: Secondary | ICD-10-CM

## 2022-01-04 DIAGNOSIS — N1832 Chronic kidney disease, stage 3b: Secondary | ICD-10-CM

## 2022-01-04 DIAGNOSIS — F39 Unspecified mood [affective] disorder: Secondary | ICD-10-CM

## 2022-01-04 DIAGNOSIS — I10 Essential (primary) hypertension: Secondary | ICD-10-CM | POA: Diagnosis not present

## 2022-01-04 MED ORDER — OMEPRAZOLE 20 MG PO CPDR: 20.0000 mg | DELAYED_RELEASE_CAPSULE | Freq: Every day | ORAL | 1 refills | Status: DC

## 2022-01-04 MED ORDER — ATORVASTATIN CALCIUM 10 MG PO TABS: 10.0000 mg | ORAL_TABLET | Freq: Every day | ORAL | 1 refills | Status: DC

## 2022-01-04 MED ORDER — MIRTAZAPINE 15 MG PO TABS: 15.0000 mg | ORAL_TABLET | Freq: Every day | ORAL | 1 refills | Status: DC

## 2022-01-04 NOTE — Progress Notes (Signed)
? ? ?Date:  01/04/2022  ? ?Name:  Michelle Novak   DOB:  01-01-1932   MRN:  [id number] ? ? ?Chief Complaint: New Patient (Initial Visit) ? ?Hypertension ?This is a chronic problem. The problem has been gradually improving since onset. The problem is controlled. Pertinent negatives include no headaches or shortness of breath. Treatments tried: metoprolol, hctz, ACEI in the past. There are no compliance problems.  Hypertensive end-organ damage includes kidney disease. Identifiable causes of hypertension include a thyroid problem.  ?Gastroesophageal Reflux ?She complains of heartburn. She reports no abdominal pain, no choking, no dysphagia or no wheezing. This is a recurrent problem. Associated symptoms include weight loss. Pertinent negatives include no fatigue. She has tried a histamine-2 antagonist for the symptoms. The treatment provided mild (would like to go back on omeprazole) relief.  ?Depression ?       This is a chronic (since her son passed away in 01-26-2020 she has had decreased appetite, now living alone and more mood changes) problem.The problem is unchanged.  Associated symptoms include no fatigue and no headaches.  Treatments tried: tried mirtazapine and thought it helped but MD stopped it for unclear reasons.  Past medical history includes thyroid problem.   ?Thyroid Problem ?Presents for follow-up visit. Symptoms include anxiety and weight loss. Patient reports no constipation, diarrhea or fatigue. Her past medical history is significant for hyperlipidemia.  ?Hyperlipidemia ?This is a chronic problem. The problem is controlled. Pertinent negatives include no shortness of breath. Current antihyperlipidemic treatment includes statins.  ? ?Weight hx:   131 lb 11/2019 ?                     126 lb 09/2020 ?                     126 lb 02/2021 ?                      115 lb 07/2021 ?                      111 lb 09/2021 ? ?Lab Results  ?Component Value Date  ? NA 141 11/27/2019  ? K 4.1 11/27/2019  ? CO2 23  11/27/2019  ? GLUCOSE 89 11/27/2019  ? BUN 40 (H) 11/27/2019  ? CREATININE 1.79 (H) 11/27/2019  ? CALCIUM 10.1 11/27/2019  ? GFRNONAA 25 (L) 11/27/2019  ? ?Lab Results  ?Component Value Date  ? CHOL 220 (H) 11/27/2019  ? HDL 52 11/27/2019  ? [place] 128 (H) 11/27/2019  ? TRIG 228 (H) 11/27/2019  ? CHOLHDL 3.2 11/25/2018  ? ?Lab Results  ?Component Value Date  ? TSH 0.757 11/27/2019  ? ?No results found for: HGBA1C ?Lab Results  ?Component Value Date  ? WBC 10.2 11/27/2019  ? HGB 11.4 11/27/2019  ? HCT 35.2 11/27/2019  ? MCV 90 11/27/2019  ? PLT 274 11/27/2019  ? ?Lab Results  ?Component Value Date  ? ALT 16 11/27/2019  ? AST 20 11/27/2019  ? ALKPHOS 90 11/27/2019  ? BILITOT 0.2 11/27/2019  ? ?No results found for: 25OHVITD2, [place], VD25OH  ? ?Review of Systems  ?Constitutional:  Positive for unexpected weight change (has lost 30 lbs in past three years; 9 lbs since September 2022) and weight loss. Negative for chills, fatigue and fever.  ?HENT:  Negative for trouble swallowing.   ?Respiratory:  Negative for choking, shortness of breath and wheezing.   ?Gastrointestinal:  Positive for heartburn. Negative for abdominal pain, constipation, diarrhea and dysphagia.  ?Neurological:  Positive for light-headedness. Negative for dizziness, syncope, weakness and headaches.  ?Psychiatric/Behavioral:  Positive for depression and dysphoric mood. Negative for sleep disturbance. The patient is nervous/anxious.   ? ?Patient Active Problem List  ? Diagnosis Date Noted  ? Chronic kidney disease, stage 3b ([place]) 02/14/2021  ? Essential hypertension 09/17/2020  ? Bradycardia 05/15/2018  ? Advanced care planning/counseling discussion 11/15/2017  ? Seborrheic keratosis 11/15/2017  ? Palpitations 03/30/2016  ? Hypothyroidism   ? Osteoporosis   ? Hyperlipidemia   ? Esophageal reflux   ? Heart murmur   ? ? ?No Known Allergies ? ?Past Surgical History:  ?Procedure Laterality Date  ? ABDOMINAL HYSTERECTOMY    ? JOINT REPLACEMENT     ? ? ?Social History  ? ?Tobacco Use  ? Smoking status: Never  ? Smokeless tobacco: Never  ?Vaping Use  ? Vaping Use: Never used  ?Substance Use Topics  ? Alcohol use: No  ? Drug use: No  ? ? ? ?Medication list has been reviewed and updated. ? ?Current Meds  ?Medication Sig  ? atorvastatin (LIPITOR) 10 MG tablet Take 1 tablet (10 mg total) by mouth at bedtime.  ? famotidine (PEPCID) 20 MG tablet Take 20 mg by mouth 2 (two) times daily.  ? levothyroxine (SYNTHROID) 50 MCG tablet Take 50 mcg by mouth daily before breakfast.  ? [DISCONTINUED] levothyroxine (SYNTHROID) 50 MCG tablet Take 1 tablet (50 mcg total) by mouth daily before breakfast.  ? ? ?PHQ 2/9 Scores 01/04/2022 11/27/2019 11/25/2018 10/24/2018  ?PHQ - 2 Score 0 0 0 0  ?PHQ- 9 Score 3 1 0 -  ? ? ?GAD 7 : Generalized Anxiety Score 01/04/2022 11/27/2019  ?Nervous, Anxious, on Edge 0 0  ?Control/stop worrying 1 1  ?Worry too much - different things 0 1  ?Trouble relaxing 1 0  ?Restless 0 0  ?Easily annoyed or irritable 0 0  ?Afraid - awful might happen 1 0  ?Total GAD 7 Score 3 2  ?Anxiety Difficulty Not difficult at all Somewhat difficult  ? ? ?BP Readings from Last 3 Encounters:  ?01/04/22 136/82  ?11/27/19 134/86  ?11/25/18 122/73  ? ? ?Physical Exam ?Vitals and nursing note reviewed.  ?Constitutional:   ?   General: She is not in acute distress. ?   Appearance: Normal appearance. She is well-developed.  ?HENT:  ?   Head: Normocephalic and atraumatic.  ?Cardiovascular:  ?   Rate and Rhythm: Normal rate and regular rhythm.  ?   Heart sounds: No murmur heard. ?Pulmonary:  ?   Effort: Pulmonary effort is normal. No respiratory distress.  ?   Breath sounds: No wheezing or rhonchi.  ?Musculoskeletal:  ?   Cervical back: Normal range of motion.  ?   Right lower leg: No edema.  ?   Left lower leg: No edema.  ?Lymphadenopathy:  ?   Cervical: No cervical adenopathy.  ?Skin: ?   General: Skin is warm and dry.  ?   Capillary Refill: Capillary refill takes less than 2  seconds.  ?   Findings: No rash.  ?Neurological:  ?   General: No focal deficit present.  ?   Mental Status: She is alert and oriented to person, place, and time.  ?Psychiatric:     ?   Mood and Affect: Mood normal.     ?   Behavior: Behavior normal.  ? ? ?Wt Readings from Last 3 Encounters:  ?  01/04/22 113 lb (51.3 kg)  ?11/27/19 131 lb (59.4 kg)  ?11/25/18 141 lb (64 kg)  ? ? ?BP 136/82   Pulse (!) 111   Ht 5' 1.8" (1.57 m)   Wt 113 lb (51.3 kg)   SpO2 98%   BMI 20.80 kg/m?  ? ?Assessment and Plan: ?1. Essential hypertension ?BP is controlled off of medications for now.  Most recently stopped HCTZ. ?Complicated by renal disease - seen by Nephrology at [facility] last year ?- CBC with Differential/Platelet ? ?2. Gastroesophageal reflux disease without esophagitis ?Gerd sx not controlled by Pepcid; will discontinue and resume omeprazole. ?- omeprazole (PRILOSEC) 20 MG capsule; Take 1 capsule (20 mg total) by mouth daily.  Dispense: 90 capsule; Refill: 1 ? ?3. Acquired hypothyroidism ?supplemented ?- TSH + free T4 ? ?4. Chronic kidney disease, stage 3b ([place]) ?Will check labs; continue to monitor BP ?Continue statin ?- Comprehensive metabolic panel ? ?5. Mood disorder ([place]) ?Since her son's passing in mid 2021 ?She has 4 other children locally who are involved ?She will resume Mirtazapine and follow up in 2 months for weight check ?Continue Ensure or similar twice a day between meals ?- mirtazapine (REMERON) 15 MG tablet; Take 1 tablet (15 mg total) by mouth at bedtime.  Dispense: 90 tablet; Refill: 1 ? ?6. Pure hypercholesterolemia ?Continue statin therapy ?- atorvastatin (LIPITOR) 10 MG tablet; Take 1 tablet (10 mg total) by mouth at bedtime.  Dispense: 90 tablet; Refill: 1 ?- Lipid panel ? ? ?Partially dictated using Editor, commissioning. Any errors are unintentional. ? ?Halina Maidens, MD ?[facility] ?[facility] ? ?01/04/2022 ? ? ? ? ? ?

## 2022-01-04 NOTE — Progress Notes (Signed)
? ? ?  Date:  01/04/2022  ? ?Name:  Michelle Novak   DOB:  1931-12-26   MRN:  [id number] ? ? ?Chief Complaint: New Patient (Initial Visit) ? ?HPI ? ?Lab Results  ?Component Value Date  ? NA 141 11/27/2019  ? K 4.1 11/27/2019  ? CO2 23 11/27/2019  ? GLUCOSE 89 11/27/2019  ? BUN 40 (H) 11/27/2019  ? CREATININE 1.79 (H) 11/27/2019  ? CALCIUM 10.1 11/27/2019  ? GFRNONAA 25 (L) 11/27/2019  ? ?Lab Results  ?Component Value Date  ? CHOL 220 (H) 11/27/2019  ? HDL 52 11/27/2019  ? [place] 128 (H) 11/27/2019  ? TRIG 228 (H) 11/27/2019  ? CHOLHDL 3.2 11/25/2018  ? ?Lab Results  ?Component Value Date  ? TSH 0.757 11/27/2019  ? ?No results found for: HGBA1C ?Lab Results  ?Component Value Date  ? WBC 10.2 11/27/2019  ? HGB 11.4 11/27/2019  ? HCT 35.2 11/27/2019  ? MCV 90 11/27/2019  ? PLT 274 11/27/2019  ? ?Lab Results  ?Component Value Date  ? ALT 16 11/27/2019  ? AST 20 11/27/2019  ? ALKPHOS 90 11/27/2019  ? BILITOT 0.2 11/27/2019  ? ?No results found for: 25OHVITD2, [place], VD25OH  ? ?Review of Systems ? ?Patient Active Problem List  ? Diagnosis Date Noted  ? Bradycardia 05/15/2018  ? Advanced care planning/counseling discussion 11/15/2017  ? Seborrheic keratosis 11/15/2017  ? Palpitations 03/30/2016  ? Hypothyroidism   ? Osteoporosis   ? Hyperlipidemia   ? Esophageal reflux   ? Hypertension   ? Hypertensive kidney disease with CKD stage III ([place])   ? Heart murmur   ? ? ?No Known Allergies ? ?Past Surgical History:  ?Procedure Laterality Date  ? ABDOMINAL HYSTERECTOMY    ? JOINT REPLACEMENT    ? ? ?Social History  ? ?Tobacco Use  ? Smoking status: Never  ? Smokeless tobacco: Never  ?Vaping Use  ? Vaping Use: Never used  ?Substance Use Topics  ? Alcohol use: No  ? Drug use: No  ? ? ? ?Medication list has been reviewed and updated. ? ?No outpatient medications have been marked as taking for the 01/04/22 encounter (Office Visit) with Glean Hess, MD.  ? ? ?PHQ 2/9 Scores 11/27/2019 11/25/2018 10/24/2018 05/15/2018  ?PHQ - 2  Score 0 0 0 0  ?PHQ- 9 Score 1 0 - -  ? ? ?GAD 7 : Generalized Anxiety Score 11/27/2019  ?Nervous, Anxious, on Edge 0  ?Control/stop worrying 1  ?Worry too much - different things 1  ?Trouble relaxing 0  ?Restless 0  ?Easily annoyed or irritable 0  ?Afraid - awful might happen 0  ?Total GAD 7 Score 2  ?Anxiety Difficulty Somewhat difficult  ? ? ?BP Readings from Last 3 Encounters:  ?11/27/19 134/86  ?11/25/18 122/73  ?10/24/18 118/62  ? ? ?Physical Exam ? ?Wt Readings from Last 3 Encounters:  ?11/27/19 131 lb (59.4 kg)  ?11/25/18 141 lb (64 kg)  ?10/24/18 144 lb (65.3 kg)  ? ? ?Ht 5' 1.8" (1.57 m)   BMI 24.12 kg/m?  ? ?Assessment and Plan: ? ? ? ? ?

## 2022-01-04 NOTE — Patient Instructions (Signed)
Stop Famotidine ? ?Resume Omeprazole 20 mg once a day ? ?Resume Mirtazapine 15 mg at bedtime ? ?Continue the same thyroid and cholesterol medication. ?

## 2022-01-05 LAB — LIPID PANEL
Chol/HDL Ratio: 4.6 ratio — ABNORMAL HIGH (ref 0.0–4.4)
Cholesterol, Total: 205 mg/dL — ABNORMAL HIGH (ref 100–199)
HDL: 45 mg/dL (ref >39)
LDL Chol Calc (NIH): 137 mg/dL — ABNORMAL HIGH (ref 0–99)
Triglycerides: 129 mg/dL (ref 0–149)
VLDL Cholesterol Cal: 23 mg/dL (ref 5–40)

## 2022-01-05 LAB — CBC WITH DIFFERENTIAL/PLATELET
Basophils Absolute: 0.1 10*3/uL (ref 0.0–0.2)
Basos: 1 %
EOS (ABSOLUTE): 0.1 10*3/uL (ref 0.0–0.4)
Eos: 1 %
Hematocrit: 33.2 % — ABNORMAL LOW (ref 34.0–46.6)
Hemoglobin: 10.3 g/dL — ABNORMAL LOW (ref 11.1–15.9)
Immature Grans (Abs): 0 10*3/uL (ref 0.0–0.1)
Immature Granulocytes: 0 %
Lymphocytes Absolute: 4.2 10*3/uL — ABNORMAL HIGH (ref 0.7–3.1)
Lymphs: 43 %
MCH: 26.6 pg (ref 26.6–33.0)
MCHC: 31 g/dL — ABNORMAL LOW (ref 31.5–35.7)
MCV: 86 fL (ref 79–97)
Monocytes Absolute: 0.5 10*3/uL (ref 0.1–0.9)
Monocytes: 5 %
Neutrophils Absolute: 5 10*3/uL (ref 1.4–7.0)
Neutrophils: 50 %
Platelets: 391 10*3/uL (ref 150–450)
RBC: 3.87 x10E6/uL (ref 3.77–5.28)
RDW: 15.1 % (ref 11.7–15.4)
WBC: 9.9 10*3/uL (ref 3.4–10.8)

## 2022-01-05 LAB — COMPREHENSIVE METABOLIC PANEL
ALT: 25 IU/L (ref 0–32)
AST: 32 IU/L (ref 0–40)
Albumin/Globulin Ratio: 2 (ref 1.2–2.2)
Albumin: 5.1 g/dL — ABNORMAL HIGH (ref 3.6–4.6)
Alkaline Phosphatase: 107 IU/L (ref 44–121)
BUN/Creatinine Ratio: 19 (ref 12–28)
BUN: 29 mg/dL — ABNORMAL HIGH (ref 8–27)
Bilirubin Total: 0.5 mg/dL (ref 0.0–1.2)
CO2: 21 mmol/L (ref 20–29)
Calcium: 10.7 mg/dL — ABNORMAL HIGH (ref 8.7–10.3)
Chloride: 99 mmol/L (ref 96–106)
Creatinine, Ser: 1.54 mg/dL — ABNORMAL HIGH (ref 0.57–1.00)
Globulin, Total: 2.6 g/dL (ref 1.5–4.5)
Glucose: 81 mg/dL (ref 70–99)
Potassium: 4.9 mmol/L (ref 3.5–5.2)
Sodium: 139 mmol/L (ref 134–144)
Total Protein: 7.7 g/dL (ref 6.0–8.5)
eGFR: 32 mL/min/{1.73_m2} — ABNORMAL LOW (ref >59)

## 2022-01-05 LAB — TSH+FREE T4
Free T4: 1.39 ng/dL (ref 0.82–1.77)
TSH: 0.645 u[IU]/mL (ref 0.450–4.500)

## 2022-02-22 ENCOUNTER — Other Ambulatory Visit: Payer: Self-pay

## 2022-02-22 DIAGNOSIS — E7800 Pure hypercholesterolemia, unspecified: Secondary | ICD-10-CM

## 2022-02-22 MED ORDER — ATORVASTATIN CALCIUM 10 MG PO TABS: 10.0000 mg | ORAL_TABLET | Freq: Every day | ORAL | 1 refills | Status: DC

## 2022-02-22 NOTE — Progress Notes (Signed)
Pharmacy reports they have refills on Atorvastatin from 01/04/22 and do not refills. ?

## 2022-02-23 ENCOUNTER — Telehealth: Payer: Self-pay

## 2022-02-23 NOTE — Telephone Encounter (Signed)
Called patient daughter Kenney Houseman to inform her that we sent in atorvastatin to patients pharmacy and she should be able to pick this up. ? ?PEC may give info if Kenney Houseman returns call for daughter. ?

## 2022-03-15 ENCOUNTER — Ambulatory Visit (INDEPENDENT_AMBULATORY_CARE_PROVIDER_SITE_OTHER): Payer: Medicare HMO | Admitting: Internal Medicine

## 2022-03-15 ENCOUNTER — Encounter: Payer: Self-pay | Admitting: Internal Medicine

## 2022-03-15 VITALS — BP 124/78 | HR 95 | Ht 61.8 in | Wt 112.2 lb

## 2022-03-15 DIAGNOSIS — I10 Essential (primary) hypertension: Secondary | ICD-10-CM | POA: Diagnosis not present

## 2022-03-15 DIAGNOSIS — N1832 Chronic kidney disease, stage 3b: Secondary | ICD-10-CM | POA: Diagnosis not present

## 2022-03-15 DIAGNOSIS — E039 Hypothyroidism, unspecified: Secondary | ICD-10-CM

## 2022-03-15 DIAGNOSIS — D485 Neoplasm of uncertain behavior of skin: Secondary | ICD-10-CM | POA: Diagnosis not present

## 2022-03-15 MED ORDER — LEVOTHYROXINE SODIUM 50 MCG PO TABS: 50.0000 ug | ORAL_TABLET | Freq: Every day | ORAL | 1 refills | Status: DC

## 2022-03-15 NOTE — Progress Notes (Signed)
? ? ?Date:  03/15/2022  ? ?Name:  Michelle Novak   DOB:  April 06, 1932   MRN:  [id number] ? ? ?Chief Complaint: Hypertension and Weight Check ? ?Hypertension ?This is a chronic problem. The problem is controlled. Pertinent negatives include no chest pain, headaches or shortness of breath. Identifiable causes of hypertension include a thyroid problem.  ?Thyroid Problem ?Presents for follow-up visit. Patient reports no anxiety or fatigue. The symptoms have been stable.  ?CKD - GFR has been stable over the past 4 years.  She does not take nsaids.  She feels well.  Weight is down one lb.  Her last nephrology visit was one year ago.  No specific recommendations were made at that time.  She has elected not to continue there. ?Skin lesion - she developed a pimple on her left check several months ago.  She tried to express material but nothing came out.  Since then it seems to be getting larger.  No drainage, no pain.  It does not interfer with eating or sleeping. ? ?Lab Results  ?Component Value Date  ? NA 139 01/04/2022  ? K 4.9 01/04/2022  ? CO2 21 01/04/2022  ? GLUCOSE 81 01/04/2022  ? BUN 29 (H) 01/04/2022  ? CREATININE 1.54 (H) 01/04/2022  ? CALCIUM 10.7 (H) 01/04/2022  ? EGFR 32 (L) 01/04/2022  ? GFRNONAA 25 (L) 11/27/2019  ? ?Lab Results  ?Component Value Date  ? CHOL 205 (H) 01/04/2022  ? HDL 45 01/04/2022  ? LDLCALC 137 (H) 01/04/2022  ? TRIG 129 01/04/2022  ? CHOLHDL 4.6 (H) 01/04/2022  ? ?Lab Results  ?Component Value Date  ? TSH 0.645 01/04/2022  ? ?No results found for: HGBA1C ?Lab Results  ?Component Value Date  ? WBC 9.9 01/04/2022  ? HGB 10.3 (L) 01/04/2022  ? HCT 33.2 (L) 01/04/2022  ? MCV 86 01/04/2022  ? PLT 391 01/04/2022  ? ?Lab Results  ?Component Value Date  ? ALT 25 01/04/2022  ? AST 32 01/04/2022  ? ALKPHOS 107 01/04/2022  ? BILITOT 0.5 01/04/2022  ? ?No results found for: 25OHVITD2, [place], VD25OH  ? ?Review of Systems  ?Constitutional:  Negative for appetite change, chills, fatigue and fever.   ?HENT:  Negative for trouble swallowing.   ?Respiratory:  Negative for chest tightness and shortness of breath.   ?Cardiovascular:  Negative for chest pain and leg swelling.  ?Gastrointestinal:  Negative for abdominal pain.  ?Skin:   ?     Lesion on left cheek  ?Neurological:  Negative for dizziness and headaches.  ?Psychiatric/Behavioral:  Negative for dysphoric mood and sleep disturbance. The patient is not nervous/anxious.   ? ?Patient Active Problem List  ? Diagnosis Date Noted  ? Chronic kidney disease, stage 3b ([place]) 02/14/2021  ? Essential hypertension 09/17/2020  ? Bradycardia 05/15/2018  ? Advanced care planning/counseling discussion 11/15/2017  ? Seborrheic keratosis 11/15/2017  ? Palpitations 03/30/2016  ? Hypothyroidism   ? Osteoporosis   ? Hyperlipidemia   ? Esophageal reflux   ? Heart murmur   ? ? ?No Known Allergies ? ?Past Surgical History:  ?Procedure Laterality Date  ? ABDOMINAL HYSTERECTOMY    ? JOINT REPLACEMENT    ? ? ?Social History  ? ?Tobacco Use  ? Smoking status: Never  ? Smokeless tobacco: Never  ?Vaping Use  ? Vaping Use: Never used  ?Substance Use Topics  ? Alcohol use: No  ? Drug use: No  ? ? ? ?Medication list has been reviewed and updated. ? ?  Current Meds  ?Medication Sig  ? atorvastatin (LIPITOR) 10 MG tablet Take 1 tablet (10 mg total) by mouth at bedtime.  ? levothyroxine (SYNTHROID) 50 MCG tablet Take 50 mcg by mouth daily before breakfast.  ? mirtazapine (REMERON) 15 MG tablet Take 1 tablet (15 mg total) by mouth at bedtime.  ? omeprazole (PRILOSEC) 20 MG capsule Take 1 capsule (20 mg total) by mouth daily.  ? ? ? ?  03/15/2022  ? 11:05 AM 01/04/2022  ?  1:54 PM 11/27/2019  ?  4:09 PM  ?GAD 7 : Generalized Anxiety Score  ?Nervous, Anxious, on Edge 0 0 0  ?Control/stop worrying 0 1 1  ?Worry too much - different things 0 0 1  ?Trouble relaxing 0 1 0  ?Restless 0 0 0  ?Easily annoyed or irritable 0 0 0  ?Afraid - awful might happen 0 1 0  ?Total GAD 7 Score 0 3 2  ?Anxiety Difficulty  Not difficult at all Not difficult at all Somewhat difficult  ? ? ? ?  03/15/2022  ? 11:05 AM  ?Depression screen PHQ 2/9  ?Decreased Interest 1  ?Down, Depressed, Hopeless 0  ?PHQ - 2 Score 1  ?Altered sleeping 0  ?Tired, decreased energy 1  ?Change in appetite 1  ?Feeling bad or failure about yourself  0  ?Trouble concentrating 0  ?Moving slowly or fidgety/restless 0  ?Suicidal thoughts 0  ?PHQ-9 Score 3  ?Difficult doing work/chores Not difficult at all  ? ? ?BP Readings from Last 3 Encounters:  ?03/15/22 124/78  ?01/04/22 136/82  ?11/27/19 134/86  ? ? ?Physical Exam ?Vitals and nursing note reviewed.  ?Constitutional:   ?   General: She is not in acute distress. ?   Appearance: She is well-developed. She is not ill-appearing.  ?HENT:  ?   Head: Normocephalic and atraumatic.  ?Cardiovascular:  ?   Rate and Rhythm: Normal rate and regular rhythm.  ?   Pulses: Normal pulses.  ?Pulmonary:  ?   Effort: Pulmonary effort is normal. No respiratory distress.  ?   Breath sounds: No wheezing or rhonchi.  ?Musculoskeletal:  ?   Cervical back: Normal range of motion.  ?   Right lower leg: No edema.  ?   Left lower leg: No edema.  ?Lymphadenopathy:  ?   Cervical: No cervical adenopathy.  ?Skin: ?   General: Skin is warm and dry.  ?   Findings: No rash.  ?   Comments: 4 mm raised cystic lesion with lateral enlarged pore.  Deeper soft non tender mass of 1 cm within the cheek. Mild erythema on the cheek surface (pt states she applied Vicks last night - was not red prior to that).  ?Neurological:  ?   General: No focal deficit present.  ?   Mental Status: She is alert and oriented to person, place, and time.  ?Psychiatric:     ?   Mood and Affect: Mood normal.     ?   Behavior: Behavior normal.  ? ? ?Wt Readings from Last 3 Encounters:  ?03/15/22 112 lb 3.2 oz (50.9 kg)  ?01/04/22 113 lb (51.3 kg)  ?11/27/19 131 lb (59.4 kg)  ? ? ?BP 124/78   Pulse 95   Ht 5' 1.8" (1.57 m)   Wt 112 lb 3.2 oz (50.9 kg)   SpO2 97%   BMI 20.65  kg/m?  ? ?Assessment and Plan: ?1. Essential hypertension ?Clinically stable exam with well controlled BP. ?Tolerating medications without side  effects at this time. ?Pt to continue current regimen and low sodium diet;  ? ?2. Neoplasm of uncertain behavior of skin of face ?Appears to be a comedone on the surface - with a deeper component ?Does not appear to be infected ?- Ambulatory referral to Dermatology ? ?3. Chronic kidney disease, stage 3b ([place]) ?This has been stable ?Will follow up in 3 months with labs ?Encourage her to continue healthy diet along with Ensure 2-3 times per day ? ?4. Acquired hypothyroidism ?supplemented ?- levothyroxine (SYNTHROID) 50 MCG tablet; Take 1 tablet (50 mcg total) by mouth daily before breakfast.  Dispense: 90 tablet; Refill: 1 ? ? ?Partially dictated using Editor, commissioning. Any errors are unintentional. ? ?Halina Maidens, MD ?[facility] ?[facility] ? ?03/15/2022 ? ? ? ? ? ?

## 2022-03-21 ENCOUNTER — Telehealth: Payer: Self-pay

## 2022-03-21 NOTE — Telephone Encounter (Signed)
Copied from [place] [id number]. Topic: Referral - Request for Referral ?>> Mar 21, 2022 11:05 AM Tessa Lerner A wrote: ?The patient has received their referral for dermatology and scheduled an appointment 09/18/22 ? ?The patient would like to know if there is another office that may be able to see them sooner  ? ?Please contact further when possible ?

## 2022-03-21 NOTE — Telephone Encounter (Signed)
Please review.  KP

## 2022-03-28 ENCOUNTER — Telehealth: Payer: Self-pay | Admitting: Internal Medicine

## 2022-03-28 NOTE — Telephone Encounter (Signed)
Copied from [place] [phone]. Topic: Referral - Status >> Mar 28, 2022  1:50 PM Erick Blinks wrote: Reason for CRM: Pt's daughter called to report that the current referral cannot see her until November. She wants to be seen elsewhere and sooner. Please advise   Best contact: [phone]

## 2022-03-28 NOTE — Telephone Encounter (Signed)
Sent message to referral coordinator to inform to send new referral to [facility].

## 2022-04-07 ENCOUNTER — Other Ambulatory Visit: Payer: Self-pay | Admitting: Internal Medicine

## 2022-04-07 ENCOUNTER — Encounter: Payer: Self-pay | Admitting: Internal Medicine

## 2022-04-07 DIAGNOSIS — K219 Gastro-esophageal reflux disease without esophagitis: Secondary | ICD-10-CM

## 2022-04-07 NOTE — Telephone Encounter (Signed)
Requested Prescriptions  Pending Prescriptions Disp Refills  . omeprazole (PRILOSEC) 20 MG capsule [Pharmacy Med Name: OMEPRAZOLE '20MG'$  CAPSULES] 90 capsule 1    Sig: TAKE 1 CAPSULE(20 MG) BY MOUTH DAILY     Gastroenterology: Proton Pump Inhibitors Passed - 04/07/2022 10:02 AM      Passed - Valid encounter within last 12 months    Recent Outpatient Visits          3 weeks ago Essential hypertension   [facility] Glean Hess, MD   3 months ago Essential hypertension   [facility] Glean Hess, MD   2 years ago Annual physical exam   [facility] Volney American, [place]   2 years ago Essential hypertension   [place], Mark A, MD   3 years ago Essential hypertension   [place], Mark A, MD      Future Appointments            In 2 months [place] Melia Jesse Sans, MD [facility], [place]   In 5 months Ralene Bathe, MD [place]

## 2022-04-18 ENCOUNTER — Telehealth: Payer: Self-pay | Admitting: Internal Medicine

## 2022-04-18 NOTE — Telephone Encounter (Signed)
Copied from [place] [id number]. Topic: Medicare AWV >> Apr 18, 2022  1:45 PM Jae Dire wrote: Reason for CRM:  Left message for patient to call back and schedule Medicare Annual Wellness Visit (AWV) in office.   If unable to come into the office for AWV,  please offer to do virtually or by telephone.  Last AWV: 10/24/2018  Please schedule at anytime with [facility] Health Advisor.      30 minute appointment for Virtual or phone 45 minute appointment for in office or Initial virtual/phone  Any questions, please call me at [phone]

## 2022-06-18 ENCOUNTER — Other Ambulatory Visit: Payer: Self-pay | Admitting: Internal Medicine

## 2022-06-18 DIAGNOSIS — F39 Unspecified mood [affective] disorder: Secondary | ICD-10-CM

## 2022-06-18 DIAGNOSIS — E7800 Pure hypercholesterolemia, unspecified: Secondary | ICD-10-CM

## 2022-06-19 ENCOUNTER — Ambulatory Visit (INDEPENDENT_AMBULATORY_CARE_PROVIDER_SITE_OTHER): Payer: Medicare HMO | Admitting: Internal Medicine

## 2022-06-19 ENCOUNTER — Encounter: Payer: Self-pay | Admitting: Internal Medicine

## 2022-06-19 VITALS — BP 104/66 | HR 109 | Ht 61.0 in | Wt 106.0 lb

## 2022-06-19 DIAGNOSIS — E039 Hypothyroidism, unspecified: Secondary | ICD-10-CM

## 2022-06-19 DIAGNOSIS — I10 Essential (primary) hypertension: Secondary | ICD-10-CM | POA: Diagnosis not present

## 2022-06-19 DIAGNOSIS — R634 Abnormal weight loss: Secondary | ICD-10-CM

## 2022-06-19 DIAGNOSIS — R1901 Right upper quadrant abdominal swelling, mass and lump: Secondary | ICD-10-CM

## 2022-06-19 DIAGNOSIS — N1832 Chronic kidney disease, stage 3b: Secondary | ICD-10-CM | POA: Diagnosis not present

## 2022-06-19 NOTE — Progress Notes (Signed)
Date:  06/19/2022   Name:  Michelle Novak   DOB:  June 04, 1932   MRN:  [id number]   Chief Complaint: Hypothyroidism, Hypertension, and Weight Check (Still does not have a appetite lost 6 pounds since last visit /)  Decreased appetite - she continues to eat very small meals in general.  She relates trying to eat and feeling that the food fills her mouth.  She denies trouble swallowing or early satiety.  She has supplemented with Ensure - sounds like 1-2 per day when she does not eat much.  Hypertension This is a chronic problem. The problem is controlled. Pertinent negatives include no chest pain or shortness of breath. Identifiable causes of hypertension include a thyroid problem.  Thyroid Problem Presents for follow-up visit. Symptoms include weight loss. Patient reports no anxiety, constipation, diarrhea or fatigue. The symptoms have been stable.    Lab Results  Component Value Date   NA 139 01/04/2022   K 4.9 01/04/2022   CO2 21 01/04/2022   GLUCOSE 81 01/04/2022   BUN 29 (H) 01/04/2022   CREATININE 1.54 (H) 01/04/2022   CALCIUM 10.7 (H) 01/04/2022   EGFR 32 (L) 01/04/2022   GFRNONAA 25 (L) 11/27/2019   Lab Results  Component Value Date   CHOL 205 (H) 01/04/2022   HDL 45 01/04/2022   LDLCALC 137 (H) 01/04/2022   TRIG 129 01/04/2022   CHOLHDL 4.6 (H) 01/04/2022   Lab Results  Component Value Date   TSH 0.645 01/04/2022   No results found for: "HGBA1C" Lab Results  Component Value Date   WBC 9.9 01/04/2022   HGB 10.3 (L) 01/04/2022   HCT 33.2 (L) 01/04/2022   MCV 86 01/04/2022   PLT 391 01/04/2022   Lab Results  Component Value Date   ALT 25 01/04/2022   AST 32 01/04/2022   ALKPHOS 107 01/04/2022   BILITOT 0.5 01/04/2022   No results found for: "25OHVITD2", "25OHVITD3", "VD25OH"   Review of Systems  Constitutional:  Positive for appetite change and weight loss. Negative for activity change, fatigue and fever.  Respiratory:  Negative for chest  tightness and shortness of breath.   Cardiovascular:  Negative for chest pain and leg swelling.  Gastrointestinal:  Negative for abdominal pain, constipation and diarrhea.  Psychiatric/Behavioral:  Negative for sleep disturbance. The patient is not nervous/anxious.     Patient Active Problem List   Diagnosis Date Noted   Chronic kidney disease, stage 3b (HCC) 02/14/2021   Essential hypertension 09/17/2020   Bradycardia 05/15/2018   Advanced care planning/counseling discussion 11/15/2017   Seborrheic keratosis 11/15/2017   Palpitations 03/30/2016   Hypothyroidism    Osteoporosis    Hyperlipidemia    Esophageal reflux    Heart murmur     No Known Allergies  Past Surgical History:  Procedure Laterality Date   ABDOMINAL HYSTERECTOMY     JOINT REPLACEMENT      Social History   Tobacco Use   Smoking status: Never   Smokeless tobacco: Never  Vaping Use   Vaping Use: Never used  Substance Use Topics   Alcohol use: No   Drug use: No     Medication list has been reviewed and updated.  Current Meds  Medication Sig   atorvastatin (LIPITOR) 10 MG tablet TAKE 1 TABLET(10 MG) BY MOUTH AT BEDTIME.   levothyroxine (SYNTHROID) 50 MCG tablet Take 1 tablet (50 mcg total) by mouth daily before breakfast.   mirtazapine (REMERON) 15 MG tablet TAKE 1 TABLET(15 MG)  BY MOUTH AT BEDTIME   omeprazole (PRILOSEC) 20 MG capsule TAKE 1 CAPSULE(20 MG) BY MOUTH DAILY       06/19/2022    3:09 PM 03/15/2022   11:05 AM 01/04/2022    1:54 PM 11/27/2019    4:09 PM  GAD 7 : Generalized Anxiety Score  Nervous, Anxious, on Edge 0 0 0 0  Control/stop worrying 0 0 1 1  Worry too much - different things 0 0 0 1  Trouble relaxing 0 0 1 0  Restless 0 0 0 0  Easily annoyed or irritable 1 0 0 0  Afraid - awful might happen 0 0 1 0  Total GAD 7 Score 1 0 3 2  Anxiety Difficulty Not difficult at all Not difficult at all Not difficult at all Somewhat difficult       06/19/2022    3:09 PM 03/15/2022    11:05 AM 01/04/2022    1:54 PM  Depression screen PHQ 2/9  Decreased Interest 1 1 0  Down, Depressed, Hopeless 1 0 0  PHQ - 2 Score 2 1 0  Altered sleeping 0 0 0  Tired, decreased energy $RemoveBeforeDE'2 1 1  'vFRHKoLogCNMIfv$ Change in appetite $RemoveBef'2 1 2  'axeMUbNrtB$ Feeling bad or failure about yourself  0 0 0  Trouble concentrating 0 0 0  Moving slowly or fidgety/restless 0 0 0  Suicidal thoughts 0 0 0  PHQ-9 Score $RemoveBef'6 3 3  'ogHTVMpZDt$ Difficult doing work/chores Not difficult at all Not difficult at all Not difficult at all    BP Readings from Last 3 Encounters:  06/19/22 104/66  03/15/22 124/78  01/04/22 136/82    Physical Exam Vitals and nursing note reviewed.  Constitutional:      General: She is not in acute distress.    Appearance: Normal appearance. She is well-developed.  HENT:     Head: Normocephalic and atraumatic.  Cardiovascular:     Rate and Rhythm: Normal rate and regular rhythm.     Pulses: Normal pulses.     Heart sounds: No murmur heard. Pulmonary:     Effort: Pulmonary effort is normal. No respiratory distress.     Breath sounds: No wheezing or rhonchi.  Abdominal:     General: Abdomen is flat. Bowel sounds are normal.     Palpations: Abdomen is soft. There is mass (fullness in the right upper quadrant).    Musculoskeletal:     Cervical back: Normal range of motion.  Lymphadenopathy:     Cervical: No cervical adenopathy.  Skin:    General: Skin is warm and dry.     Findings: No rash.  Neurological:     Mental Status: She is alert and oriented to person, place, and time.  Psychiatric:        Mood and Affect: Mood normal.        Behavior: Behavior normal.     Wt Readings from Last 3 Encounters:  06/19/22 106 lb (48.1 kg)  03/15/22 112 lb 3.2 oz (50.9 kg)  01/04/22 113 lb (51.3 kg)    BP 104/66   Pulse (!) 109   Ht $R'5\' 1"'oq$  (1.549 m)   Wt 106 lb (48.1 kg)   SpO2 97%   BMI 20.03 kg/m   Assessment and Plan: 1. Essential hypertension Clinically stable exam with well controlled BP. Tolerating  medications without side effects at this time. Pt to continue current regimen and low sodium diet; benefits of regular exercise as able discussed. - CBC with Differential/Platelet  2.  Acquired hypothyroidism Recent labs were normal.  3. Chronic kidney disease, stage 3b [facility]) Monitoring labs - Basic metabolic panel  4. Abdominal mass, right upper quadrant Recommend CT to further evaluate - CT Abdomen Pelvis Wo Contrast; Future  5. Abnormal weight loss Continue Remeron and Ensure CT may help delineate    Partially dictated using Editor, commissioning. Any errors are unintentional.  Halina Maidens, MD [facility]  06/19/2022

## 2022-06-19 NOTE — Telephone Encounter (Signed)
Requested Prescriptions  Pending Prescriptions Disp Refills  . mirtazapine (REMERON) 15 MG tablet [Pharmacy Med Name: MIRTAZAPINE '15MG'$  TABLETS] 90 tablet 1    Sig: TAKE 1 TABLET(15 MG) BY MOUTH AT BEDTIME     Psychiatry: Antidepressants - mirtazapine Passed - 06/18/2022  6:18 AM      Passed - Valid encounter within last 6 months    Recent Outpatient Visits          3 months ago Essential hypertension   [place], [place]   5 months ago Essential hypertension   [facility] Glean Hess, MD   2 years ago Annual physical exam   [facility] Vol[facility] American, [place]   3 years ago Essential hypertension   [place], Jeannette How, [place]   3 years ago Essential hypertension   [place], Jeannette How, [place]      Future Appointments            Today Glean Hess, MD [facility], [place]   In 3 months Ralene Bathe, MD [place]           . atorvastatin (LIPITOR) 10 MG tablet [Pharmacy Med Name: ATORVASTATIN '10MG'$  TABLETS] 90 tablet 1    Sig: TAKE 1 TABLET(10 MG) BY MOUTH AT BEDTIME.     Cardiovascular:  Antilipid - Statins Failed - 06/18/2022  6:18 AM      Failed - Lipid Panel in normal range within the last 12 months    Cholesterol, Total  Date Value Ref Range Status  01/04/2022 205 (H) 100 - 199 mg/dL Final   Cholesterol Piccolo, Waived  Date Value Ref Range Status  06/04/2019 200 (H) <200 mg/dL Final    Comment:                            Desirable                <200                         Borderline High      200- 239                         High                     >239    LDL Chol Calc (NIH)  Date Value Ref Range Status  01/04/2022 137 (H) 0 - 99 mg/dL Final   HDL  Date Value Ref Range Status  01/04/2022 45 >39 mg/dL Final   Triglycerides  Date Value Ref Range Status  01/04/2022 129 0 - 149 mg/dL Final   Triglycerides Piccolo,Waived  Date Value Ref  Range Status  06/04/2019 106 <150 mg/dL Final    Comment:                            Normal                   <150                         Borderline High     150 - 199  High                200 - 499                         Very High                >499          Passed - Patient is not pregnant      Passed - Valid encounter within last 12 months    Recent Outpatient Visits          3 months ago Essential hypertension   [place], [place]   5 months ago Essential hypertension   [facility] Glean Hess, MD   2 years ago Annual physical exam   [facility]ney [facility], [place]   3 years ago Essential hypertension   [place], Jeannette How, MD   3 years ago Essential hypertension   [place], Jeannette How, MD      Future Appointments            Today Glean Hess, MD [facility], [place]   In 3 months Ralene Bathe, MD [place]

## 2022-06-20 LAB — CBC WITH DIFFERENTIAL/PLATELET
Basophils Absolute: 0.1 10*3/uL (ref 0.0–0.2)
Basos: 1 %
EOS (ABSOLUTE): 0.1 10*3/uL (ref 0.0–0.4)
Eos: 1 %
Hematocrit: 30.7 % — ABNORMAL LOW (ref 34.0–46.6)
Hemoglobin: 9.6 g/dL — ABNORMAL LOW (ref 11.1–15.9)
Immature Grans (Abs): 0 10*3/uL (ref 0.0–0.1)
Immature Granulocytes: 0 %
Lymphocytes Absolute: 3.8 10*3/uL — ABNORMAL HIGH (ref 0.7–3.1)
Lymphs: 45 %
MCH: 26.6 pg (ref 26.6–33.0)
MCHC: 31.3 g/dL — ABNORMAL LOW (ref 31.5–35.7)
MCV: 85 fL (ref 79–97)
Monocytes Absolute: 0.5 10*3/uL (ref 0.1–0.9)
Monocytes: 6 %
Neutrophils Absolute: 4.1 10*3/uL (ref 1.4–7.0)
Neutrophils: 47 %
Platelets: 343 10*3/uL (ref 150–450)
RBC: 3.61 x10E6/uL — ABNORMAL LOW (ref 3.77–5.28)
RDW: 14.5 % (ref 11.7–15.4)
WBC: 8.5 10*3/uL (ref 3.4–10.8)

## 2022-06-20 LAB — BASIC METABOLIC PANEL
BUN/Creatinine Ratio: 22 (ref 12–28)
BUN: 38 mg/dL — ABNORMAL HIGH (ref 8–27)
CO2: 22 mmol/L (ref 20–29)
Calcium: 10.8 mg/dL — ABNORMAL HIGH (ref 8.7–10.3)
Chloride: 99 mmol/L (ref 96–106)
Creatinine, Ser: 1.7 mg/dL — ABNORMAL HIGH (ref 0.57–1.00)
Glucose: 76 mg/dL (ref 70–99)
Potassium: 5 mmol/L (ref 3.5–5.2)
Sodium: 140 mmol/L (ref 134–144)
eGFR: 29 mL/min/{1.73_m2} — ABNORMAL LOW (ref >59)

## 2022-06-21 NOTE — Progress Notes (Signed)
Pt is aware additional labs were added.  KP

## 2022-06-22 ENCOUNTER — Encounter: Payer: Self-pay | Admitting: Internal Medicine

## 2022-06-22 DIAGNOSIS — D649 Anemia, unspecified: Secondary | ICD-10-CM | POA: Insufficient documentation

## 2022-06-23 ENCOUNTER — Other Ambulatory Visit: Payer: Self-pay

## 2022-06-23 ENCOUNTER — Encounter: Payer: Self-pay | Admitting: Internal Medicine

## 2022-06-23 DIAGNOSIS — E611 Iron deficiency: Secondary | ICD-10-CM

## 2022-06-23 LAB — IRON AND TIBC
Iron Saturation: 8 % — CL (ref 15–55)
Iron: 43 ug/dL (ref 27–139)
Total Iron Binding Capacity: 555 ug/dL (ref 250–450)
UIBC: 512 ug/dL — ABNORMAL HIGH (ref 118–369)

## 2022-06-23 LAB — SPECIMEN STATUS REPORT

## 2022-06-23 LAB — VITAMIN B12: Vitamin B-12: 1137 pg/mL (ref 232–1245)

## 2022-06-23 LAB — FERRITIN: Ferritin: 526 ng/mL — ABNORMAL HIGH (ref 15–150)

## 2022-06-26 ENCOUNTER — Encounter: Payer: Self-pay | Admitting: Internal Medicine

## 2022-06-26 ENCOUNTER — Other Ambulatory Visit: Payer: Self-pay

## 2022-06-26 DIAGNOSIS — K219 Gastro-esophageal reflux disease without esophagitis: Secondary | ICD-10-CM

## 2022-06-26 DIAGNOSIS — E039 Hypothyroidism, unspecified: Secondary | ICD-10-CM

## 2022-06-26 MED ORDER — OMEPRAZOLE 20 MG PO CPDR: 20.0000 mg | DELAYED_RELEASE_CAPSULE | Freq: Every day | ORAL | 1 refills | Status: DC

## 2022-06-26 MED ORDER — LEVOTHYROXINE SODIUM 50 MCG PO TABS: 50.0000 ug | ORAL_TABLET | Freq: Every day | ORAL | 1 refills | Status: DC

## 2022-06-26 NOTE — Telephone Encounter (Signed)
Please review.  KP

## 2022-06-27 ENCOUNTER — Ambulatory Visit
Admission: RE | Admit: 2022-06-27 | Discharge: 2022-06-27 | Disposition: A | Discharge status: Home / Self Care | Payer: Medicare HMO | Source: Ambulatory Visit | Attending: Internal Medicine | Admitting: Internal Medicine

## 2022-06-27 DIAGNOSIS — R1901 Right upper quadrant abdominal swelling, mass and lump: Secondary | ICD-10-CM | POA: Insufficient documentation

## 2022-06-28 ENCOUNTER — Other Ambulatory Visit: Payer: Self-pay | Admitting: Internal Medicine

## 2022-06-28 DIAGNOSIS — R160 Hepatomegaly, not elsewhere classified: Secondary | ICD-10-CM

## 2022-06-29 ENCOUNTER — Other Ambulatory Visit: Payer: Self-pay

## 2022-06-29 ENCOUNTER — Inpatient Hospital Stay: Payer: Medicare HMO

## 2022-06-29 ENCOUNTER — Inpatient Hospital Stay: Payer: Medicare HMO | Attending: Internal Medicine | Admitting: Internal Medicine

## 2022-06-29 ENCOUNTER — Encounter: Payer: Self-pay | Admitting: Internal Medicine

## 2022-06-29 VITALS — HR 94 | Temp 97.9°F | Resp 18 | Wt 106.3 lb

## 2022-06-29 DIAGNOSIS — I129 Hypertensive chronic kidney disease with stage 1 through stage 4 chronic kidney disease, or unspecified chronic kidney disease: Secondary | ICD-10-CM | POA: Diagnosis not present

## 2022-06-29 DIAGNOSIS — Z79899 Other long term (current) drug therapy: Secondary | ICD-10-CM | POA: Diagnosis not present

## 2022-06-29 DIAGNOSIS — N1831 Chronic kidney disease, stage 3a: Secondary | ICD-10-CM

## 2022-06-29 DIAGNOSIS — R160 Hepatomegaly, not elsewhere classified: Secondary | ICD-10-CM

## 2022-06-29 DIAGNOSIS — I700 Atherosclerosis of aorta: Secondary | ICD-10-CM | POA: Diagnosis not present

## 2022-06-29 DIAGNOSIS — N1832 Chronic kidney disease, stage 3b: Secondary | ICD-10-CM | POA: Diagnosis not present

## 2022-06-29 DIAGNOSIS — D631 Anemia in chronic kidney disease: Secondary | ICD-10-CM | POA: Insufficient documentation

## 2022-06-29 DIAGNOSIS — K5730 Diverticulosis of large intestine without perforation or abscess without bleeding: Secondary | ICD-10-CM | POA: Insufficient documentation

## 2022-06-29 DIAGNOSIS — E785 Hyperlipidemia, unspecified: Secondary | ICD-10-CM | POA: Diagnosis not present

## 2022-06-29 DIAGNOSIS — E039 Hypothyroidism, unspecified: Secondary | ICD-10-CM | POA: Insufficient documentation

## 2022-06-29 DIAGNOSIS — E611 Iron deficiency: Secondary | ICD-10-CM | POA: Insufficient documentation

## 2022-06-29 DIAGNOSIS — Z808 Family history of malignant neoplasm of other organs or systems: Secondary | ICD-10-CM | POA: Diagnosis not present

## 2022-06-29 DIAGNOSIS — K219 Gastro-esophageal reflux disease without esophagitis: Secondary | ICD-10-CM | POA: Diagnosis not present

## 2022-06-29 DIAGNOSIS — Z79890 Hormone replacement therapy (postmenopausal): Secondary | ICD-10-CM | POA: Insufficient documentation

## 2022-06-29 DIAGNOSIS — Z8249 Family history of ischemic heart disease and other diseases of the circulatory system: Secondary | ICD-10-CM | POA: Insufficient documentation

## 2022-06-29 DIAGNOSIS — D509 Iron deficiency anemia, unspecified: Secondary | ICD-10-CM

## 2022-06-29 LAB — HEPATIC FUNCTION PANEL
ALT: 63 U/L — ABNORMAL HIGH (ref 0–44)
AST: 71 U/L — ABNORMAL HIGH (ref 15–41)
Albumin: 4.8 g/dL (ref 3.5–5.0)
Alkaline Phosphatase: 97 U/L (ref 38–126)
Bilirubin, Direct: 0.1 mg/dL (ref 0.0–0.2)
Indirect Bilirubin: 0.6 mg/dL (ref 0.3–0.9)
Total Bilirubin: 0.7 mg/dL (ref 0.3–1.2)
Total Protein: 8.2 g/dL — ABNORMAL HIGH (ref 6.5–8.1)

## 2022-06-29 MED ORDER — SENNA 8.6 MG PO TABS: 1.0000 | ORAL_TABLET | Freq: Every day | ORAL | 0 refills | Status: AC | PRN

## 2022-06-29 MED ORDER — FERROUS SULFATE 325 (65 FE) MG PO TBEC: 325.0000 mg | DELAYED_RELEASE_TABLET | ORAL | 3 refills | Status: DC

## 2022-06-29 MED ORDER — VITAMIN C 250 MG PO TABS: 250.0000 mg | ORAL_TABLET | Freq: Every day | ORAL | 0 refills | Status: AC

## 2022-06-29 NOTE — Progress Notes (Signed)
Provided nurse navigator information. Will make referral to [facility] GI oncology per request. Pet scheduled and instructions reviewed. Encouraged to call with any further questions or needs.

## 2022-06-29 NOTE — Progress Notes (Signed)
Patient here today for initial evaluation regarding liver mass on recent CT scan.

## 2022-06-29 NOTE — Progress Notes (Addendum)
[facility] NOTE  Patient Care Team: Glean Hess, MD as PCP - General (Internal Medicine) Clent Jacks, RN as Oncology Nurse Navigator  REFERRING PROVIDER: Dr. Army Melia  REASON FOR REFFERAL: liver masses   ASSESSMENT & PLAN:  Michelle Novak is a 88 yo F with pmh of HLD, osteoporosis and hypothyroidism was referred to Oncology clinic for RUQ abdominal pain with imaging showing multiple liver masses.   #Liver masses -CT abdomen pelvis wo contrast done on 06/28/2019 by PCP due to palpable mass on exam. showed multiple liver masses with largest measuring 13 cm concerning for metastatic disease, sclerosis of L1 and sacrum which is non-specific versus metastasis.  -Recommended PET CT scan to assess the extent of the disease.  Patient was agreeable and was ordered.  -Recommended IR guided liver biopsy.  Patient is interested in transferring care to medical oncology at [facility].  She is unsure at this time if she would want to have biopsy here or at [facility].  She needs some time to think about it and she will contact us.  In the meantime we will make a referral for medical oncology at [facility] to start the process. Our GI coordinator Mariea Clonts will assist.  - Labs today.  # Normocytic anemia  - secondary to anemia of chronic disease from CKD with component of iron deficiency - Labs reviewed. CBC from 06/19/22 showed Hb 9.6. Ferritin ~500, but TIBC is elevated and iron saturation is 8% - started oral iron supplements every other day with vitamin C. If does not respond, discussed about IV iron.   - Her last colonoscopy was several years ago.  She denies any bleeding or changes in stool.  I discussed that it is unclear at this time if a finding of low iron is a separate process or part of the liver masses that we are seeing which could be related colon tumor causing slow bleeding. CT abdo did not show concerning in colon. We will follow up with PET. May need colonoscopy down the  line.   - check EPO level. If low, consider EPO injections.  - b12 level normal    Orders Placed This Encounter  Procedures   NM PET Image Initial (PI) Skull Base To Thigh    Standing Status:   Future    Standing Expiration Date:   06/29/2023    Order Specific Question:   If indicated for the ordered procedure, I authorize the administration of a radiopharmaceutical per Radiology protocol    Answer:   Yes    Order Specific Question:   Preferred imaging location?    Answer:   [facility]   Hepatic function panel    Standing Status:   Future    Number of Occurrences:   1    Standing Expiration Date:   06/29/2023   Erythropoietin    Standing Status:   Future    Number of Occurrences:   1    Standing Expiration Date:   06/29/2023   RTC in 3 weeks for MD visit, discuss scans.   The total time spent in the appointment was 60 minutes encounter with patients including review of chart and various tests results, discussions about plan of care and coordination of care plan   All questions were answered. The patient knows to call the clinic with any problems, questions or concerns. No barriers to learning was detected.  Michelle Canary, MD 8/24/202312:21 PM   HISTORY OF PRESENTING ILLNESS:  Michelle Novak  88 y.o. female with pmh of HLD, osteoporosis and hypothyroidism was referred to Oncology clinic for RUQ abdominal pain with imaging showing multiple liver masses.   Patient was seen by Dr. Army Melia, PCP for routine visit on 06/19/2022.  Patient reports on the exam of her abdomen her doctor felt something abnormal.  CT abdomen pelvis was done on 06/27/2022 which showed multiple liver masses with largest measuring 13 cm concerning for metastatic disease, sclerosis of L1 and sacrum which is non-specific versus metastasis.  Patient was seen today in the clinic along with her daughter, Michelle Novak and second daughter over the phone. Patient denies fever, chills, nausea, vomiting, shortness of  breath, cough, abdominal pain, bleeding, bowel or bladder issues. Energy level is good. At times she can feel lousy.  She reports she has never been a good eater.  She reports about 6 pound weight loss in past few weeks.  She lives by herself and is independent with her ADLs.  She is able to drive and do her own groceries and laundry.  Her children live close by.   I have reviewed her chart and materials related to her cancer extensively and collaborated history with the patient.  MEDICAL HISTORY:  Past Medical History:  Diagnosis Date   Arthritis    Chronic kidney disease    Esophageal reflux    Heart murmur    History of abnormal cervical Pap smear    Hyperlipidemia    Hypertension    Hypothyroidism    Osteoporosis     SURGICAL HISTORY: Past Surgical History:  Procedure Laterality Date   ABDOMINAL HYSTERECTOMY     JOINT REPLACEMENT      SOCIAL HISTORY: Social History   Socioeconomic History   Marital status: Widowed    Spouse name: Not on file   Number of children: Not on file   Years of education: 12   Highest education level: 12th grade  Occupational History   Not on file  Tobacco Use   Smoking status: Never   Smokeless tobacco: Never  Vaping Use   Vaping Use: Never used  Substance and Sexual Activity   Alcohol use: No   Drug use: No   Sexual activity: Not on file  Other Topics Concern   Not on file  Social History Narrative   Not on file   Social Determinants of Health   Financial Resource Strain: Low Risk  (06/19/2022)   Overall Financial Resource Strain (CARDIA)    Difficulty of Paying Living Expenses: Not hard at all  Food Insecurity: No Food Insecurity (06/19/2022)   Hunger Vital Sign    Worried About Running Out of Food in the Last Year: Never true    [place] in the Last Year: Never true  Transportation Needs: No Transportation Needs (06/19/2022)   PRAPARE - Hydrologist (Medical): No    Lack of Transportation  (Non-Medical): No  Physical Activity: Inactive (10/03/2017)   Exercise Vital Sign    Days of Exercise per Week: 0 days    Minutes of Exercise per Session: 0 min  Stress: No Stress Concern Present (10/03/2017)   [place]    Feeling of Stress : Not at all  Social Connections: Moderately Integrated (10/03/2017)   Social Connection and Isolation Panel [NHANES]    Frequency of Communication with Friends and Family: More than three times a week    Frequency of Social Gatherings with Friends and Family:  More than three times a week    Attends Religious Services: More than 4 times per year    Active Member of Clubs or Organizations: Yes    Attends Archivist Meetings: More than 4 times per year    Marital Status: Widowed  Intimate Partner Violence: Not At Risk (06/19/2022)   Humiliation, Afraid, Rape, and Kick questionnaire    Fear of Current or Ex-Partner: No    Emotionally Abused: No    Physically Abused: No    Sexually Abused: No    FAMILY HISTORY: Family History  Problem Relation Age of Onset   Heart disease Mother    Heart attack Mother    Cancer Father        bone   Heart disease Sister     ALLERGIES:  has No Known Allergies.  MEDICATIONS:  Current Outpatient Medications  Medication Sig Dispense Refill   atorvastatin (LIPITOR) 10 MG tablet TAKE 1 TABLET(10 MG) BY MOUTH AT BEDTIME. 90 tablet 1   ferrous sulfate 325 (65 FE) MG EC tablet Take 1 tablet (325 mg total) by mouth every other day. 15 tablet 3   levothyroxine (SYNTHROID) 50 MCG tablet Take 1 tablet (50 mcg total) by mouth daily before breakfast. 90 tablet 1   mirtazapine (REMERON) 15 MG tablet TAKE 1 TABLET(15 MG) BY MOUTH AT BEDTIME 90 tablet 1   omeprazole (PRILOSEC) 20 MG capsule Take 1 capsule (20 mg total) by mouth daily. 90 capsule 1   senna (SENOKOT) 8.6 MG TABS tablet Take 1 tablet (8.6 mg total) by mouth daily as needed for mild  constipation. 30 tablet 0   vitamin C (ASCORBIC ACID) 250 MG tablet Take 1 tablet (250 mg total) by mouth daily. 30 tablet 0   No current facility-administered medications for this visit.    REVIEW OF SYSTEMS:   Pertinent information mentioned in HPI All other systems were reviewed with the patient and are negative.  PHYSICAL EXAMINATION: ECOG PERFORMANCE STATUS: 1 - Symptomatic but completely ambulatory  Vitals:   06/29/22 0930  Pulse: 94  Resp: 18  Temp: 97.9 F (36.6 C)  SpO2: 98%   Filed Weights   06/29/22 0930  Weight: 106 lb 4.8 oz (48.2 kg)    GENERAL:alert, no distress and comfortable SKIN: skin color, texture, turgor are normal, no rashes or significant lesions EYES: normal, conjunctiva are pink and non-injected, sclera clear OROPHARYNX:no exudate, no erythema and lips, buccal mucosa, and tongue normal  NECK: supple, thyroid normal size, non-tender, without nodularity LYMPH:  no palpable lymphadenopathy in the cervical, axillary or inguinal LUNGS: clear to auscultation and percussion with normal breathing effort HEART: regular rate & rhythm and no murmurs and no lower extremity edema ABDOMEN: mass palpable in right upper quadrant. Rest soft, non tender Musculoskeletal:no cyanosis of digits and no clubbing  PSYCH: alert & oriented x 3 with fluent speech NEURO: no focal motor/sensory deficits  LABORATORY DATA:  I have reviewed the data as listed Lab Results  Component Value Date   WBC 8.5 06/19/2022   HGB 9.6 (L) 06/19/2022   HCT 30.7 (L) 06/19/2022   MCV 85 06/19/2022   PLT 343 06/19/2022   Recent Labs    01/04/22 1430 06/19/22 1558 06/29/22 1033  NA 139 140  --   K 4.9 5.0  --   CL 99 99  --   CO2 21 22  --   GLUCOSE 81 76  --   BUN 29* 38*  --   CREATININE  1.54* 1.70*  --   CALCIUM 10.7* 10.8*  --   PROT 7.7  --  8.2*  ALBUMIN 5.1*  --  4.8  AST 32  --  71*  ALT 25  --  63*  ALKPHOS 107  --  97  BILITOT 0.5  --  0.7  BILIDIR  --   --  0.1   IBILI  --   --  0.6    RADIOGRAPHIC STUDIES: I have personally reviewed the radiological images as listed and agreed with the findings in the report. CT Abdomen Pelvis Wo Contrast  Result Date: 06/28/2022 CLINICAL DATA:  Palpable abdominal mass. EXAM: CT ABDOMEN AND PELVIS WITHOUT CONTRAST TECHNIQUE: Multidetector CT imaging of the abdomen and pelvis was performed following the standard protocol without IV contrast. RADIATION DOSE REDUCTION: This exam was performed according to the departmental dose-optimization program which includes automated exposure control, adjustment of the mA and/or kV according to patient size and/or use of iterative reconstruction technique. COMPARISON:  None Available. FINDINGS: Lower chest: No acute abnormality. Hepatobiliary: Multiple heterogeneous masses identified throughout the liver with the largest mass measuring 13.2 x 10 cm centered in the right lobe liver extending to the left lobe liver. Gallbladder is not definitely seen. There is no evidence of biliary dilatation. Pancreas: Unremarkable. No pancreatic ductal dilatation or surrounding inflammatory changes. Spleen: Normal in size without focal abnormality. Adrenals/Urinary Tract: Adrenal glands are unremarkable. Kidneys are normal, without renal calculi, focal lesion, or hydronephrosis. Bladder is unremarkable. Stomach/Bowel: There is mild bowel wall thickening at the gastroesophageal junction. There is no small bowel obstruction or diverticulitis. Diverticulosis of colon is noted. The appendix is normal. Vascular/Lymphatic: Aortic atherosclerosis. No enlarged abdominal or pelvic lymph nodes. Reproductive: Status post hysterectomy. No adnexal masses. Other: None. Musculoskeletal: Small focal sclerosis is identified in the L1 vertebral body. Generalized increased sclerosis is identified proximal sacrum. IMPRESSION: 1. Multiple heterogeneous masses identified throughout the liver with the largest mass measuring 13.2 x 10  cm centered in the right lobe liver extending to the left lobe liver. Findings are consistent with metastatic disease. 2. Small focal sclerosis is identified in the L1 vertebral body. Generalized increased sclerosis is identified in the proximal sacrum. This is nonspecific but could represent metastatic disease. 3. Mild bowel wall thickening at the gastroesophageal junction, nonspecific. Aortic Atherosclerosis (ICD10-I70.0). Electronically Signed   By: Abelardo Diesel M.D.   On: 06/28/2022 11:30

## 2022-06-30 LAB — ERYTHROPOIETIN: Erythropoietin: 15.9 m[IU]/mL (ref 2.6–18.5)

## 2022-07-05 ENCOUNTER — Ambulatory Visit (INDEPENDENT_AMBULATORY_CARE_PROVIDER_SITE_OTHER): Payer: Medicare HMO

## 2022-07-05 VITALS — Ht 61.0 in | Wt 106.0 lb

## 2022-07-05 DIAGNOSIS — Z0000 Encounter for general adult medical examination without abnormal findings: Secondary | ICD-10-CM | POA: Diagnosis not present

## 2022-07-05 NOTE — Progress Notes (Signed)
Subjective:   Michelle Novak is a 88 y.o. female who presents for Medicare Annual (Subsequent) preventive examination.  I connected with  Michelle Novak on 07/05/22 by a audio enabled telemedicine application and verified that I am speaking with the correct person using two identifiers.  Patient Location: Home  Provider Location: Office/Clinic  I discussed the limitations of evaluation and management by telemedicine. The patient expressed understanding and agreed to proceed.   Review of Systems    Defer to PCP Cardiac Risk Factors include: advanced age (>32mn, >>44women)     Objective:    Today's Vitals   07/05/22 1451  Weight: 106 lb (48.1 kg)  Height: '5\' 1"'$  (1.549 m)   Body mass index is 20.03 kg/m.     07/05/2022    3:35 PM 06/29/2022    9:27 AM 10/24/2018   11:34 AM 10/03/2017   11:08 AM 04/28/2015    3:18 PM  Advanced Directives  Does Patient Have a Medical Advance Directive? Yes Yes Yes Yes Yes  Type of Advance Directive  Living will;Healthcare Power of [place]   Does patient want to make changes to medical advance directive? No - Patient declined  No - Patient declined    Copy of [place] Chart?   Yes - validated most recent copy scanned in chart (See row information) No - copy requested     Current Medications (verified) Outpatient Encounter Medications as of 07/05/2022  Medication Sig   atorvastatin (LIPITOR) 10 MG tablet TAKE 1 TABLET(10 MG) BY MOUTH AT BEDTIME.   ferrous sulfate 325 (65 FE) MG EC tablet Take 1 tablet (325 mg total) by mouth every other day.   levothyroxine (SYNTHROID) 50 MCG tablet Take 1 tablet (50 mcg total) by mouth daily before breakfast.   mirtazapine (REMERON) 15 MG tablet TAKE 1 TABLET(15 MG) BY MOUTH AT BEDTIME   omeprazole (PRILOSEC) 20 MG capsule Take 1 capsule (20 mg total) by mouth daily.   senna (SENOKOT) 8.6 MG TABS tablet  Take 1 tablet (8.6 mg total) by mouth daily as needed for mild constipation.   vitamin C (ASCORBIC ACID) 250 MG tablet Take 1 tablet (250 mg total) by mouth daily.   No facility-administered encounter medications on file as of 07/05/2022.    Allergies (verified) Patient has no known allergies.   History: Past Medical History:  Diagnosis Date   Arthritis    Chronic kidney disease    Esophageal reflux    Heart murmur    History of abnormal cervical Pap smear    Hyperlipidemia    Hypertension    Hypothyroidism    Osteoporosis    Past Surgical History:  Procedure Laterality Date   ABDOMINAL HYSTERECTOMY     JOINT REPLACEMENT     Family History  Problem Relation Age of Onset   Heart disease Mother    Heart attack Mother    Cancer Father        bone   Heart disease Sister    Social History   Socioeconomic History   Marital status: Widowed    Spouse name: Not on file   Number of children: Not on file   Years of education: 12   Highest education level: 12th grade  Occupational History   Not on file  Tobacco Use   Smoking status: Never   Smokeless tobacco: Never  Vaping Use   Vaping Use: Never used  Substance  and Sexual Activity   Alcohol use: No   Drug use: No   Sexual activity: Not on file  Other Topics Concern   Not on file  Social History Narrative   Not on file   Social Determinants of Health   Financial Resource Strain: Low Risk  (06/19/2022)   Overall Financial Resource Strain (CARDIA)    Difficulty of Paying Living Expenses: Not hard at all  Food Insecurity: No Food Insecurity (06/19/2022)   Hunger Vital Sign    Worried About Running Out of Food in the Last Year: Never true    Ran Out of Food in the Last Year: Never true  Transportation Needs: No Transportation Needs (06/19/2022)   PRAPARE - Hydrologist (Medical): No    Lack of Transportation (Non-Medical): No  Physical Activity: Inactive (07/05/2022)   Exercise Vital  Sign    Days of Exercise per Week: 0 days    Minutes of Exercise per Session: 0 min  Stress: No Stress Concern Present (07/05/2022)   [place]    Feeling of Stress : Not at all  Social Connections: Moderately Integrated (07/05/2022)   Social Connection and Isolation Panel [NHANES]    Frequency of Communication with Friends and Family: More than three times a week    Frequency of Social Gatherings with Friends and Family: Three times a week    Attends Religious Services: More than 4 times per year    Active Member of Clubs or Organizations: Yes    Attends Archivist Meetings: More than 4 times per year    Marital Status: Widowed    Tobacco Counseling Counseling given: Not Answered   Clinical Intake:  Pre-visit preparation completed: Yes  Pain : No/denies pain     Diabetes: No     Diabetic? No.     Information entered by :: Wyatt Haste, CMA   Activities of Daily Living    07/05/2022    3:36 PM 01/04/2022    1:54 PM  In your present state of health, do you have any difficulty performing the following activities:  Hearing? 0 0  Vision? 0 0  Difficulty concentrating or making decisions? 0 0  Walking or climbing stairs? 1 1  Dressing or bathing? 0 0  Doing errands, shopping? 1 1  Preparing Food and eating ? N   Using the Toilet? N   In the past six months, have you accidently leaked urine? N   Do you have problems with loss of bowel control? N   Managing your Medications? N   Managing your Finances? N   Housekeeping or managing your Housekeeping? N     Patient Care Team: Glean Hess, MD as PCP - General (Internal Medicine) Clent Jacks, RN as Oncology Nurse Navigator  Indicate any recent Medical Services you may have received from other than [facility] providers in the past year (date may be approximate).     Assessment:   This is a routine wellness examination for  [facility].  Hearing/Vision screen Vision Screening - Comments:: Prescription reading glasses.  Dietary issues and exercise activities discussed:     Goals Addressed   None   Depression Screen    07/05/2022    3:34 PM 06/19/2022    3:09 PM 03/15/2022   11:05 AM 01/04/2022    1:54 PM 11/27/2019    4:08 PM 11/25/2018    9:16 AM 10/24/2018   11:34 AM  PHQ 2/9 Scores  PHQ - 2 Score '2 2 1 '$ 0 0 0 0  PHQ- 9 Score '2 6 3 3 1 '$ 0     Fall Risk    07/05/2022    3:36 PM 06/19/2022    3:10 PM 03/15/2022   11:05 AM 01/04/2022    1:54 PM 11/27/2019    4:08 PM  Fall Risk   Falls in the past year? 0 0 0 0 0  Number falls in past yr: 0 0 0 0 0  Injury with Fall? 0 0 0 0 0  Risk for fall due to : No Fall Risks No Fall Risks No Fall Risks No Fall Risks   Follow up Falls evaluation completed Falls evaluation completed Falls evaluation completed Falls evaluation completed     [place]:  Any stairs in or around the home? Yes  If so, are there any without handrails? Yes  Home free of loose throw rugs in walkways, pet beds, electrical cords, etc? Yes  Adequate lighting in your home to reduce risk of falls? Yes   ASSISTIVE DEVICES UTILIZED TO PREVENT FALLS:  Life alert? No  Use of a cane, walker or w/c? No  Grab bars in the bathroom? Yes  Shower chair or bench in shower? Yes  Elevated toilet seat or a handicapped toilet? No   TIMED UP AND GO:  Was the test performed? No .   Cognitive Function:        07/05/2022    3:36 PM 10/24/2018   11:34 AM 10/03/2017   11:11 AM  6CIT Screen  What Year? 0 points 0 points 0 points  What month? 0 points 0 points 0 points  What time? 0 points 0 points 0 points  Count back from 20 0 points 0 points 0 points  Months in reverse 0 points 0 points 0 points  Repeat phrase 4 points 6 points 2 points  Total Score 4 points 6 points 2 points    Immunizations Immunization History  Administered Date(s) Administered    Influenza, High Dose Seasonal PF 10/02/2016   Influenza,inj,quad, With Preservative 09/03/2018   Influenza-Unspecified 08/21/2013, 08/17/2015, 08/28/2017, 08/26/2018, 07/14/2021   Moderna Sars-Covid-2 Vaccination 12/02/2019, 12/31/2019, 09/09/2020, 04/01/2021   Pneumococcal Conjugate-13 09/23/2014, 09/21/2020   Pneumococcal Polysaccharide-23 06/28/2005, 02/08/2021   Tdap 07/30/2009   Zoster Recombinat (Shingrix) 12/20/2018, 04/08/2019, 08/20/2019   Zoster, Live 06/14/2006    TDAP status: Due, Education has been provided regarding the importance of this vaccine. Advised may receive this vaccine at local pharmacy or Health Dept. Aware to provide a copy of the vaccination record if obtained from local pharmacy or Health Dept. Verbalized acceptance and understanding.  Flu Vaccine status: Due, Education has been provided regarding the importance of this vaccine. Advised may receive this vaccine at local pharmacy or Health Dept. Aware to provide a copy of the vaccination record if obtained from local pharmacy or Health Dept. Verbalized acceptance and understanding.  Pneumococcal vaccine status: Up to date  Covid-19 vaccine status: Completed vaccines  Qualifies for Shingles Vaccine? Yes   Zostavax completed No   Shingrix Completed?: Yes  Screening Tests Health Maintenance  Topic Date Due   TETANUS/TDAP  07/31/2019   COVID-19 Vaccine (5 - Moderna risk series) 05/27/2021   INFLUENZA VACCINE  06/06/2022   Pneumonia Vaccine 69+ Years old  Completed   DEXA SCAN  Completed   Zoster Vaccines- Shingrix  Completed   HPV VACCINES  Aged Out  Health Maintenance  Health Maintenance Due  Topic Date Due   TETANUS/TDAP  07/31/2019   COVID-19 Vaccine (5 - Moderna risk series) 05/27/2021   INFLUENZA VACCINE  06/06/2022    Colorectal cancer screening: No longer required.   Mammogram status: No longer required due to age.   Lung Cancer Screening: (Low Dose CT Chest recommended if Age 22-80  years, 30 pack-year currently smoking OR have quit w/in 15years.) does not qualify.   Lung Cancer Screening Referral: N/A  Additional Screening:  Hepatitis C Screening: does not qualify.  Vision Screening: Recommended annual ophthalmology exams for early detection of glaucoma and other disorders of the eye. Is the patient up to date with their annual eye exam?  No  Who is the provider or what is the name of the office in which the patient attends annual eye exams? N/A If pt is not established with a provider, would they like to be referred to a provider to establish care? No .   Dental Screening: Recommended annual dental exams for proper oral hygiene  Community Resource Referral / Chronic Care Management: CRR required this visit?  No   CCM required this visit?  No      Plan:     I have personally reviewed and noted the following in the patient's chart:   Medical and social history Use of alcohol, tobacco or illicit drugs  Current medications and supplements including opioid prescriptions. Patient is not currently taking opioid prescriptions. Functional ability and status Nutritional status Physical activity Advanced directives List of other physicians Hospitalizations, surgeries, and ER visits in previous 12 months Vitals Screenings to include cognitive, depression, and falls Referrals and appointments  In addition, I have reviewed and discussed with patient certain preventive protocols, quality metrics, and best practice recommendations. A written personalized care plan for preventive services as well as general preventive health recommendations were provided to patient.     Clista Bernhardt, [place]   07/05/2022   Nurse Notes: None.   Ms. Helm , Thank you for taking time to come for your Medicare Wellness Visit. I appreciate your ongoing commitment to your health goals. Please review the following plan we discussed and let me know if I can assist you in the future.    These are the goals we discussed:  Goals      DIET - INCREASE WATER INTAKE     Recommend drinking at least 2-3 bottles of water a day         This is a list of the screening recommended for you and due dates:  Health Maintenance  Topic Date Due   Tetanus Vaccine  07/31/2019   COVID-19 Vaccine (5 - Moderna risk series) 05/27/2021   Flu Shot  06/06/2022   Pneumonia Vaccine  Completed   DEXA scan (bone density measurement)  Completed   Zoster (Shingles) Vaccine  Completed   HPV Vaccine  Aged Out

## 2022-07-13 ENCOUNTER — Telehealth: Payer: Self-pay

## 2022-07-13 ENCOUNTER — Encounter
Admission: RE | Admit: 2022-07-13 | Discharge: 2022-07-13 | Disposition: A | Discharge status: Home / Self Care | Payer: Medicare HMO | Source: Ambulatory Visit | Attending: Internal Medicine | Admitting: Internal Medicine

## 2022-07-13 DIAGNOSIS — D376 Neoplasm of uncertain behavior of liver, gallbladder and bile ducts: Secondary | ICD-10-CM | POA: Insufficient documentation

## 2022-07-13 DIAGNOSIS — I700 Atherosclerosis of aorta: Secondary | ICD-10-CM | POA: Insufficient documentation

## 2022-07-13 DIAGNOSIS — R160 Hepatomegaly, not elsewhere classified: Secondary | ICD-10-CM | POA: Insufficient documentation

## 2022-07-13 DIAGNOSIS — R911 Solitary pulmonary nodule: Secondary | ICD-10-CM | POA: Diagnosis not present

## 2022-07-13 LAB — GLUCOSE, CAPILLARY: Glucose-Capillary: 91 mg/dL (ref 70–99)

## 2022-07-13 MED ORDER — FLUDEOXYGLUCOSE F - 18 (FDG) INJECTION
5.2000 | Freq: Once | INTRAVENOUS | Status: AC | PRN
  Administered 2022-07-13: 5.65 via INTRAVENOUS

## 2022-07-13 NOTE — Telephone Encounter (Signed)
Noted referral to [facility] has not been scheduled. Referral refaxed.

## 2022-07-18 ENCOUNTER — Telehealth: Payer: Self-pay

## 2022-07-18 NOTE — Telephone Encounter (Signed)
Spoke with daughter Mechele Claude. Did make her aware that [facility] would not arrange the appointment without a biopsy. I let her know that I attempted to call LaTonya twice and her voicemail was full. Based on her PET scan she does need to keep her appointment on Thursday to discuss results of PET and next steps. She verbalized understanding.

## 2022-07-20 ENCOUNTER — Encounter: Payer: Self-pay | Admitting: Internal Medicine

## 2022-07-20 ENCOUNTER — Inpatient Hospital Stay: Payer: Medicare HMO

## 2022-07-20 ENCOUNTER — Other Ambulatory Visit: Payer: Self-pay

## 2022-07-20 ENCOUNTER — Inpatient Hospital Stay: Payer: Medicare HMO | Attending: Internal Medicine | Admitting: Internal Medicine

## 2022-07-20 VITALS — BP 121/88 | HR 85 | Temp 97.1°F | Resp 16 | Wt 107.0 lb

## 2022-07-20 DIAGNOSIS — K769 Liver disease, unspecified: Secondary | ICD-10-CM | POA: Diagnosis not present

## 2022-07-20 DIAGNOSIS — R160 Hepatomegaly, not elsewhere classified: Secondary | ICD-10-CM | POA: Diagnosis not present

## 2022-07-20 DIAGNOSIS — N189 Chronic kidney disease, unspecified: Secondary | ICD-10-CM | POA: Diagnosis not present

## 2022-07-20 DIAGNOSIS — D631 Anemia in chronic kidney disease: Secondary | ICD-10-CM | POA: Insufficient documentation

## 2022-07-20 DIAGNOSIS — Z79890 Hormone replacement therapy (postmenopausal): Secondary | ICD-10-CM | POA: Insufficient documentation

## 2022-07-20 DIAGNOSIS — E039 Hypothyroidism, unspecified: Secondary | ICD-10-CM | POA: Insufficient documentation

## 2022-07-20 DIAGNOSIS — E785 Hyperlipidemia, unspecified: Secondary | ICD-10-CM | POA: Diagnosis not present

## 2022-07-20 DIAGNOSIS — Z79899 Other long term (current) drug therapy: Secondary | ICD-10-CM | POA: Insufficient documentation

## 2022-07-20 DIAGNOSIS — I129 Hypertensive chronic kidney disease with stage 1 through stage 4 chronic kidney disease, or unspecified chronic kidney disease: Secondary | ICD-10-CM | POA: Diagnosis not present

## 2022-07-20 DIAGNOSIS — R932 Abnormal findings on diagnostic imaging of liver and biliary tract: Secondary | ICD-10-CM | POA: Diagnosis not present

## 2022-07-20 DIAGNOSIS — K219 Gastro-esophageal reflux disease without esophagitis: Secondary | ICD-10-CM | POA: Insufficient documentation

## 2022-07-20 NOTE — Progress Notes (Signed)
Pt in for follow up, reports she has noticed some improvement in energy levels.

## 2022-07-20 NOTE — Progress Notes (Signed)
[place] NOTE  Patient Care Team: Glean Hess, MD as PCP - General (Internal Medicine) Clent Jacks, RN as Oncology Nurse Navigator  REFERRING PROVIDER: Dr. Army Melia  REASON FOR REFFERAL: liver masses   ASSESSMENT & PLAN:  [name] is a 88 yo F with pmh of HLD, osteoporosis and hypothyroidism was referred to Oncology clinic for further work up of liver masses.   #Liver masses -CT abdomen pelvis wo contrast done on 06/28/2019 by PCP due to palpable mass on exam. showed multiple liver masses with largest measuring 13 cm concerning for metastatic disease, sclerosis of L1 and sacrum which is non-specific versus metastasis.  - PET CT scan from 07/14/2022 showed hypermetabolic liver masses largest measuring 8.6 x 12 cm in the right hepatic lobe with SUV max 12.1.  No other areas of hypermetabolic activity were seen.  I discussed in detail with the patient and daughter about getting an IR guided liver biopsy.  [facility] whoever can get her in faster. We will find out wait time for [facility].  - Tumor markers to be drawn today.   # Normocytic anemia  - secondary to anemia of chronic disease from CKD with component of iron deficiency - Labs reviewed. CBC from 06/19/22 showed Hb 9.6. Ferritin ~500, but TIBC is elevated and iron saturation is 8% -Patient is tolerating oral iron. -EPO level is low at 15.  Can consider EPO injections down the line. - b12 level normal    Orders Placed This Encounter  Procedures   AFP tumor marker    Standing Status:   Future    Number of Occurrences:   1    Standing Expiration Date:   07/20/2023   CEA    Standing Status:   Future    Number of Occurrences:   1    Standing Expiration Date:   07/20/2023   Cancer antigen 19-9    Standing Status:   Future    Number of Occurrences:   1    Standing Expiration Date:   07/20/2023   RTC in 2 weeks for MD visit  The total time spent in the  appointment was 35 minutes encounter with patients including review of chart and various tests results, discussions about plan of care and coordination of care plan   All questions were answered. The patient knows to call the clinic with any problems, questions or concerns. No barriers to learning was detected.  Jane Canary, MD 9/14/20233:13 PM   HISTORY OF PRESENTING ILLNESS:  Michelle Novak 88 y.o. female with pmh of HLD, osteoporosis and hypothyroidism was referred to Oncology clinic for further work up of liver masses.   Patient was seen by Dr. Army Melia, PCP for routine visit on 06/19/2022.  Patient reports on the exam of her abdomen her doctor felt something abnormal.  CT abdomen pelvis was done on 06/27/2022 which showed multiple liver masses with largest measuring 13 cm concerning for metastatic disease, sclerosis of L1 and sacrum which is non-specific versus metastasis.  PET CT scan from 07/14/2022 showed hypermetabolic liver masses largest measuring 8.6 x 12 cm in the right hepatic lobe with SUV max 12.1.  No other areas of hypermetabolic activity were seen.    INTERVAL HISTORY-  Patient was seen today with her daughter to discuss PET scan results.   Patient denies fever, chills, nausea, vomiting, shortness of breath, cough, abdominal pain, bleeding, bowel or bladder issues. Energy  level is good.  Appetite is good.   Denies pain.  I have reviewed her chart and materials related to her cancer extensively and collaborated history with the patient.  MEDICAL HISTORY:  Past Medical History:  Diagnosis Date   Arthritis    Chronic kidney disease    Esophageal reflux    Heart murmur    History of abnormal cervical Pap smear    Hyperlipidemia    Hypertension    Hypothyroidism    Osteoporosis     SURGICAL HISTORY: Past Surgical History:  Procedure Laterality Date   ABDOMINAL HYSTERECTOMY     JOINT REPLACEMENT      SOCIAL HISTORY: Social History   Socioeconomic  History   Marital status: Widowed    Spouse name: Not on file   Number of children: Not on file   Years of education: 12   Highest education level: 12th grade  Occupational History   Not on file  Tobacco Use   Smoking status: Never   Smokeless tobacco: Never  Vaping Use   Vaping Use: Never used  Substance and Sexual Activity   Alcohol use: No   Drug use: No   Sexual activity: Not on file  Other Topics Concern   Not on file  Social History Narrative   Not on file   Social Determinants of Health   Financial Resource Strain: Low Risk  (06/19/2022)   Overall Financial Resource Strain (CARDIA)    Difficulty of Paying Living Expenses: Not hard at all  Food Insecurity: No Food Insecurity (06/19/2022)   Hunger Vital Sign    Worried About Running Out of Food in the Last Year: Never true    [place] in the Last Year: Never true  Transportation Needs: No Transportation Needs (06/19/2022)   PRAPARE - Hydrologist (Medical): No    Lack of Transportation (Non-Medical): No  Physical Activity: Inactive (07/05/2022)   Exercise Vital Sign    Days of Exercise per Week: 0 days    Minutes of Exercise per Session: 0 min  Stress: No Stress Concern Present (07/05/2022)   [place]    Feeling of Stress : Not at all  Social Connections: Moderately Integrated (07/05/2022)   Social Connection and Isolation Panel [NHANES]    Frequency of Communication with Friends and Family: More than three times a week    Frequency of Social Gatherings with Friends and Family: Three times a week    Attends Religious Services: More than 4 times per year    Active Member of Clubs or Organizations: Yes    Attends Archivist Meetings: More than 4 times per year    Marital Status: Widowed  Intimate Partner Violence: Not At Risk (06/19/2022)   Humiliation, Afraid, Rape, and Kick questionnaire    Fear of Current  or Ex-Partner: No    Emotionally Abused: No    Physically Abused: No    Sexually Abused: No    FAMILY HISTORY: Family History  Problem Relation Age of Onset   Heart disease Mother    Heart attack Mother    Cancer Father        bone   Heart disease Sister     ALLERGIES:  has No Known Allergies.  MEDICATIONS:  Current Outpatient Medications  Medication Sig Dispense Refill   atorvastatin (LIPITOR) 10 MG tablet TAKE 1 TABLET(10 MG) BY MOUTH AT BEDTIME. 90 tablet 1   ferrous  sulfate 325 (65 FE) MG EC tablet Take 1 tablet (325 mg total) by mouth every other day. 15 tablet 3   levothyroxine (SYNTHROID) 50 MCG tablet Take 1 tablet (50 mcg total) by mouth daily before breakfast. 90 tablet 1   mirtazapine (REMERON) 15 MG tablet TAKE 1 TABLET(15 MG) BY MOUTH AT BEDTIME 90 tablet 1   omeprazole (PRILOSEC) 20 MG capsule Take 1 capsule (20 mg total) by mouth daily. 90 capsule 1   vitamin C (ASCORBIC ACID) 250 MG tablet Take 1 tablet (250 mg total) by mouth daily. 30 tablet 0   senna (SENOKOT) 8.6 MG TABS tablet Take 1 tablet (8.6 mg total) by mouth daily as needed for mild constipation. (Patient not taking: Reported on 07/20/2022) 30 tablet 0   No current facility-administered medications for this visit.    REVIEW OF SYSTEMS:   Pertinent information mentioned in HPI All other systems were reviewed with the patient and are negative.  PHYSICAL EXAMINATION: ECOG PERFORMANCE STATUS: 1 - Symptomatic but completely ambulatory  Vitals:   07/20/22 1338  BP: 121/88  Pulse: 85  Resp: 16  Temp: (!) 97.1 F (36.2 C)  SpO2: 99%   Filed Weights   07/20/22 1338  Weight: 107 lb (48.5 kg)    GENERAL:alert, no distress and comfortable SKIN: skin color, texture, turgor are normal, no rashes or significant lesions EYES: normal, conjunctiva are pink and non-injected, sclera clear OROPHARYNX:no exudate, no erythema and lips, buccal mucosa, and tongue normal  NECK: supple, thyroid normal size,  non-tender, without nodularity LYMPH:  no palpable lymphadenopathy in the cervical, axillary or inguinal LUNGS: clear to auscultation and percussion with normal breathing effort HEART: regular rate & rhythm and no murmurs and no lower extremity edema ABDOMEN: mass palpable in right upper quadrant. Rest soft, non tender Musculoskeletal:no cyanosis of digits and no clubbing  PSYCH: alert & oriented x 3 with fluent speech NEURO: no focal motor/sensory deficits  LABORATORY DATA:  I have reviewed the data as listed Lab Results  Component Value Date   WBC 8.5 06/19/2022   HGB 9.6 (L) 06/19/2022   HCT 30.7 (L) 06/19/2022   MCV 85 06/19/2022   PLT 343 06/19/2022   Recent Labs    01/04/22 1430 06/19/22 1558 06/29/22 1033  NA 139 140  --   K 4.9 5.0  --   CL 99 99  --   CO2 21 22  --   GLUCOSE 81 76  --   BUN 29* 38*  --   CREATININE 1.54* 1.70*  --   CALCIUM 10.7* 10.8*  --   PROT 7.7  --  8.2*  ALBUMIN 5.1*  --  4.8  AST 32  --  71*  ALT 25  --  63*  ALKPHOS 107  --  97  BILITOT 0.5  --  0.7  BILIDIR  --   --  0.1  IBILI  --   --  0.6    RADIOGRAPHIC STUDIES: I have personally reviewed the radiological images as listed and agreed with the findings in the report. NM PET Image Initial (PI) Skull Base To Thigh  Result Date: 07/14/2022 CLINICAL DATA:  Initial treatment strategy for liver mass. EXAM: NUCLEAR MEDICINE PET SKULL BASE TO THIGH TECHNIQUE: 5.7 mCi F-18 FDG was injected intravenously. Full-ring PET imaging was performed from the skull base to thigh after the radiotracer. CT data was obtained and used for attenuation correction and anatomic localization. Fasting blood glucose: 91 mg/dl COMPARISON:  CT abdomen  pelvis 06/27/2022. FINDINGS: Mediastinal blood pool activity: SUV max 1.9 Liver activity: SUV max NA NECK: No abnormal hypermetabolism. Incidental CT findings: None. CHEST: No abnormal hypermetabolism. Incidental CT findings: Atherosclerotic calcification of the aorta  and aortic valve. No pericardial or pleural effusion. 4 mm right lower lobe nodule (2/91), too small for PET resolution. ABDOMEN/PELVIS: Hypermetabolic masses in the right hepatic lobe measure up to roughly 8.6 x 12.0 cm with SUV max 12.1. The largest mass extends into the left hepatic lobe. No abnormal hypermetabolism in the adrenal glands, spleen or pancreas. No hypermetabolic lymph nodes. Incidental CT findings: Adrenal glands, kidneys, spleen, pancreas, stomach and bowel are unremarkable. Atherosclerotic calcification of the aorta. SKELETON: No abnormal hypermetabolism. Incidental CT findings: Degenerative changes in the spine. IMPRESSION: 1. Hypermetabolic liver masses without hypermetabolic disease elsewhere in the neck, chest, abdomen or pelvis, suggesting a primary hepatic malignancy such as hepatocellular carcinoma. 2. 4 mm right lower lobe nodule, too small for PET resolution. Recommend attention on follow-up. 3.  Aortic atherosclerosis (ICD10-I70.0). Electronically Signed   By: Lorin Picket M.D.   On: 07/14/2022 10:46   CT Abdomen Pelvis Wo Contrast  Result Date: 06/28/2022 CLINICAL DATA:  Palpable abdominal mass. EXAM: CT ABDOMEN AND PELVIS WITHOUT CONTRAST TECHNIQUE: Multidetector CT imaging of the abdomen and pelvis was performed following the standard protocol without IV contrast. RADIATION DOSE REDUCTION: This exam was performed according to the departmental dose-optimization program which includes automated exposure control, adjustment of the mA and/or kV according to patient size and/or use of iterative reconstruction technique. COMPARISON:  None Available. FINDINGS: Lower chest: No acute abnormality. Hepatobiliary: Multiple heterogeneous masses identified throughout the liver with the largest mass measuring 13.2 x 10 cm centered in the right lobe liver extending to the left lobe liver. Gallbladder is not definitely seen. There is no evidence of biliary dilatation. Pancreas: Unremarkable. No  pancreatic ductal dilatation or surrounding inflammatory changes. Spleen: Normal in size without focal abnormality. Adrenals/Urinary Tract: Adrenal glands are unremarkable. Kidneys are normal, without renal calculi, focal lesion, or hydronephrosis. Bladder is unremarkable. Stomach/Bowel: There is mild bowel wall thickening at the gastroesophageal junction. There is no small bowel obstruction or diverticulitis. Diverticulosis of colon is noted. The appendix is normal. Vascular/Lymphatic: Aortic atherosclerosis. No enlarged abdominal or pelvic lymph nodes. Reproductive: Status post hysterectomy. No adnexal masses. Other: None. Musculoskeletal: Small focal sclerosis is identified in the L1 vertebral body. Generalized increased sclerosis is identified proximal sacrum. IMPRESSION: 1. Multiple heterogeneous masses identified throughout the liver with the largest mass measuring 13.2 x 10 cm centered in the right lobe liver extending to the left lobe liver. Findings are consistent with metastatic disease. 2. Small focal sclerosis is identified in the L1 vertebral body. Generalized increased sclerosis is identified in the proximal sacrum. This is nonspecific but could represent metastatic disease. 3. Mild bowel wall thickening at the gastroesophageal junction, nonspecific. Aortic Atherosclerosis (ICD10-I70.0). Electronically Signed   By: Abelardo Diesel M.D.   On: 06/28/2022 11:30

## 2022-07-20 NOTE — Progress Notes (Signed)
Referral sent to [facility] for liver biopsy

## 2022-07-21 ENCOUNTER — Encounter: Payer: Self-pay | Admitting: Internal Medicine

## 2022-07-21 LAB — AFP TUMOR MARKER: AFP, Serum, Tumor Marker: 7.3 ng/mL (ref 0.0–8.7)

## 2022-07-21 LAB — CEA: CEA: 1 ng/mL (ref 0.0–4.7)

## 2022-07-22 LAB — CANCER ANTIGEN 19-9: CA 19-9: 10 U/mL (ref 0–35)

## 2022-07-26 ENCOUNTER — Inpatient Hospital Stay: Payer: Medicare HMO | Admitting: *Deleted

## 2022-07-26 ENCOUNTER — Telehealth: Payer: Self-pay

## 2022-07-26 NOTE — Telephone Encounter (Signed)
Liver biopsy was arranged 08/04/22 at [facility].

## 2022-07-26 NOTE — Progress Notes (Signed)
Multidisciplinary Oncology Council Documentation  Michelle Novak was presented by our [facility] on 07/26/2022, which included representatives from:  Palliative Care Dietitian  Physical/Occupational Therapist Nurse Navigator Genetics Speech Therapist Social work Survivorship RN Financial Navigator Research RN   Michelle Novak currently presents with history of liver mass  We reviewed previous medical and familial history, history of present illness, and recent lab results along with all available histopathologic and imaging studies. The [place] considered available treatment options and made the following recommendations/referrals:  - none at this time.  The MOC is a meeting of clinicians from various specialty areas who evaluate and discuss patients for whom a multidisciplinary approach is being considered. Final determinations in the plan of care are those of the provider(s).   Today's extended care, comprehensive team conference, Michelle Novak was not present for the discussion and was not examined.

## 2022-08-03 ENCOUNTER — Ambulatory Visit: Payer: Medicare HMO | Admitting: Internal Medicine

## 2022-08-07 ENCOUNTER — Encounter: Payer: Self-pay | Admitting: Internal Medicine

## 2022-08-08 ENCOUNTER — Telehealth: Payer: Self-pay | Admitting: Internal Medicine

## 2022-08-08 NOTE — Telephone Encounter (Signed)
pt daughter called in. Wanting to know if Biopsy results will be back for thursday appt.

## 2022-08-10 ENCOUNTER — Inpatient Hospital Stay: Payer: Medicare HMO | Admitting: Internal Medicine

## 2022-08-11 ENCOUNTER — Inpatient Hospital Stay: Payer: Medicare HMO | Admitting: Internal Medicine

## 2022-08-14 ENCOUNTER — Encounter: Payer: Self-pay | Admitting: Internal Medicine

## 2022-08-14 ENCOUNTER — Telehealth: Payer: Self-pay

## 2022-08-14 NOTE — Telephone Encounter (Signed)
Spoke to daughter Mechele Claude. Results of liver biopsy are back. Appointment moved to 08/15/22 at 1330 for results with Dr. Darrall Dears.

## 2022-08-15 ENCOUNTER — Inpatient Hospital Stay: Payer: Medicare HMO | Attending: Internal Medicine | Admitting: Internal Medicine

## 2022-08-15 ENCOUNTER — Encounter: Payer: Self-pay | Admitting: Internal Medicine

## 2022-08-15 ENCOUNTER — Ambulatory Visit: Payer: Medicare HMO | Admitting: Internal Medicine

## 2022-08-15 VITALS — BP 146/77 | HR 86 | Temp 97.9°F | Resp 18 | Wt 110.8 lb

## 2022-08-15 DIAGNOSIS — R160 Hepatomegaly, not elsewhere classified: Secondary | ICD-10-CM | POA: Insufficient documentation

## 2022-08-15 DIAGNOSIS — D508 Other iron deficiency anemias: Secondary | ICD-10-CM | POA: Diagnosis not present

## 2022-08-15 DIAGNOSIS — Z79890 Hormone replacement therapy (postmenopausal): Secondary | ICD-10-CM | POA: Insufficient documentation

## 2022-08-15 DIAGNOSIS — D631 Anemia in chronic kidney disease: Secondary | ICD-10-CM | POA: Diagnosis not present

## 2022-08-15 DIAGNOSIS — N1832 Chronic kidney disease, stage 3b: Secondary | ICD-10-CM | POA: Diagnosis not present

## 2022-08-15 DIAGNOSIS — K219 Gastro-esophageal reflux disease without esophagitis: Secondary | ICD-10-CM | POA: Insufficient documentation

## 2022-08-15 DIAGNOSIS — I129 Hypertensive chronic kidney disease with stage 1 through stage 4 chronic kidney disease, or unspecified chronic kidney disease: Secondary | ICD-10-CM | POA: Insufficient documentation

## 2022-08-15 DIAGNOSIS — Z79899 Other long term (current) drug therapy: Secondary | ICD-10-CM | POA: Diagnosis not present

## 2022-08-15 DIAGNOSIS — E039 Hypothyroidism, unspecified: Secondary | ICD-10-CM | POA: Diagnosis not present

## 2022-08-15 DIAGNOSIS — Z8249 Family history of ischemic heart disease and other diseases of the circulatory system: Secondary | ICD-10-CM | POA: Diagnosis not present

## 2022-08-15 DIAGNOSIS — E785 Hyperlipidemia, unspecified: Secondary | ICD-10-CM | POA: Insufficient documentation

## 2022-08-15 DIAGNOSIS — Z808 Family history of malignant neoplasm of other organs or systems: Secondary | ICD-10-CM | POA: Insufficient documentation

## 2022-08-15 DIAGNOSIS — K769 Liver disease, unspecified: Secondary | ICD-10-CM | POA: Diagnosis not present

## 2022-08-15 MED ORDER — FERROUS SULFATE 325 (65 FE) MG PO TBEC: 325.0000 mg | DELAYED_RELEASE_TABLET | Freq: Every day | ORAL | 2 refills | Status: AC

## 2022-08-15 NOTE — Progress Notes (Signed)
[place] NOTE  Patient Care Team: Glean Hess, MD as PCP - General (Internal Medicine) Clent Jacks, RN as Oncology Nurse Navigator  REFERRING PROVIDER: Dr. Army Melia  REASON FOR REFFERAL: liver masses   ASSESSMENT & PLAN:  [name] is a 88 yo F with pmh of HLD, osteoporosis and hypothyroidism was referred to Oncology clinic for further work up of liver masses.   #Liver masses -CT abdomen pelvis wo contrast done on 06/28/2019 by PCP due to palpable mass on exam. showed multiple liver masses with largest measuring 13 cm concerning for metastatic disease, sclerosis of L1 and sacrum which is non-specific versus metastasis.  - PET CT scan from 07/14/2022 showed hypermetabolic liver masses largest measuring 8.6 x 12 cm in the right hepatic lobe with SUV max 12.1.  No other areas of hypermetabolic activity were seen.  - AFP, CEA and CA 19-9 normal.   - US guided liver biopsy done at [facility] on 08/04/2022.  Pathology showed atypical clusters of hepatocytes, large amount of blood and organizing fibrin more likely to be hepatocellular adenoma, however carcinoma cannot be completely ruled out.  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  And if it was a young patient then a surgical resection would be advised. This was discussed with Dr. Roma Kayser, pathology at [facility].  -I discussed with the patient and her daughters in detail about the pathology report. Considering that pathology is not entirely conclusive, we should proceed with MRI liver protocol to further evaluate. She has CKD and a low risk of nephrogenic fibrosis was discussed.  After conversation with Dr. Ralph Leyden, if there is a concern for malignancy on MRI liver repeat liver biopsy may be considered.  Because the first sample was not optimal with large amount of blood.   # Normocytic anemia  # CKD  Stage 3b - secondary to anemia of chronic disease with component of iron deficiency - Labs reviewed. CBC from 06/19/22 showed Hb 9.6. Ferritin ~500, but TIBC is elevated and iron saturation is 8% -Patient is tolerating oral iron.  Refilled oral iron. -We will recheck iron panel on next visit. - EPO level is low at 15.  Can consider EPO injections down the line. - b12 level normal    Orders Placed This Encounter  Procedures   MR LIVER W WO CONTRAST    Standing Status:   Future    Standing Expiration Date:   08/16/2023    Order Specific Question:   If indicated for the ordered procedure, I authorize the administration of contrast media per Radiology protocol    Answer:   Yes    Order Specific Question:   What is the patient's sedation requirement?    Answer:   No Sedation    Order Specific Question:   Does the patient have a pacemaker or implanted devices?    Answer:   No    Order Specific Question:   Preferred imaging location?    Answer:   [facility] (table limit - 550lbs)   Iron and TIBC([facility])    Standing Status:   Future    Standing Expiration Date:   08/16/2023   Ferritin    Standing Status:   Future    Standing Expiration Date:   08/16/2023   CBC with Differential    Standing Status:   Future    Standing Expiration Date:   08/15/2023  Hepatic function panel    Standing Status:   Future    Standing Expiration Date:   08/15/2023   RTC in 3 weeks for MD visit, labs, discuss mri   The total time spent in the appointment was 35 minutes encounter with patients including review of chart and various tests results, discussions about plan of care and coordination of care plan   All questions were answered. The patient knows to call the clinic with any problems, questions or concerns. No barriers to learning was detected.  Jane Canary, MD 10/10/20232:04 PM   HISTORY OF PRESENTING ILLNESS:  PRINCETTA Novak 88 y.o. female with pmh of HLD, osteoporosis and  hypothyroidism was referred to Oncology clinic for further work up of liver masses.   Patient was seen by Dr. Army Melia, PCP for routine visit on 06/19/2022.  Patient reports on the exam of her abdomen her doctor felt something abnormal.  CT abdomen pelvis was done on 06/27/2022 which showed multiple liver masses with largest measuring 13 cm concerning for metastatic disease, sclerosis of L1 and sacrum which is non-specific versus metastasis.  PET CT scan from 07/14/2022 showed hypermetabolic liver masses largest measuring 8.6 x 12 cm in the right hepatic lobe with SUV max 12.1.  No other areas of hypermetabolic activity were seen.  AFP, CEA and CA 19-9 normal.  US guided liver biopsy done at [facility] on 08/04/2022.  Pathology showed atypical clusters of hepatocytes, large amount of blood and organizing fibrin more likely to be hepatocellular adenoma, however carcinoma cannot be completely ruled out.  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  This was discussed with Dr. Roma Kayser, pathology at [facility].   INTERVAL HISTORY-  Patient was seen today with her daughter to discuss liver biopsy results. She continues to feel well.  Denies any pain, nausea, vomiting.  Appetite is fair.  She ran out off her iron pills.  Had been tolerating well.  I have reviewed her chart and materials related to her cancer extensively and collaborated history with the patient.  MEDICAL HISTORY:  Past Medical History:  Diagnosis Date   Arthritis    Chronic kidney disease    Esophageal reflux    Heart murmur    History of abnormal cervical Pap smear    Hyperlipidemia    Hypertension    Hypothyroidism    Osteoporosis     SURGICAL HISTORY: Past Surgical History:  Procedure Laterality Date   ABDOMINAL HYSTERECTOMY     JOINT REPLACEMENT      SOCIAL HISTORY: Social History   Socioeconomic History   Marital status: Widowed    Spouse name: Not on file    Number of children: Not on file   Years of education: 12   Highest education level: 12th grade  Occupational History   Not on file  Tobacco Use   Smoking status: Never   Smokeless tobacco: Never  Vaping Use   Vaping Use: Never used  Substance and Sexual Activity   Alcohol use: No   Drug use: No   Sexual activity: Not on file  Other Topics Concern   Not on file  Social History Narrative   Not on file   Social Determinants of Health   Financial Resource Strain: Low Risk  (06/19/2022)   Overall Financial Resource Strain (CARDIA)    Difficulty of Paying Living Expenses: Not hard at all  Food Insecurity: No Food Insecurity (06/19/2022)   Hunger Vital Sign  Worried About Charity fundraiser in the Last Year: Never true    [place] in the Last Year: Never true  Transportation Needs: No Transportation Needs (06/19/2022)   PRAPARE - Hydrologist (Medical): No    Lack of Transportation (Non-Medical): No  Physical Activity: Inactive (07/05/2022)   Exercise Vital Sign    Days of Exercise per Week: 0 days    Minutes of Exercise per Session: 0 min  Stress: No Stress Concern Present (07/05/2022)   [place]    Feeling of Stress : Not at all  Social Connections: Moderately Integrated (07/05/2022)   Social Connection and Isolation Panel [NHANES]    Frequency of Communication with Friends and Family: More than three times a week    Frequency of Social Gatherings with Friends and Family: Three times a week    Attends Religious Services: More than 4 times per year    Active Member of Clubs or Organizations: Yes    Attends Archivist Meetings: More than 4 times per year    Marital Status: Widowed  Intimate Partner Violence: Not At Risk (06/19/2022)   Humiliation, Afraid, Rape, and Kick questionnaire    Fear of Current or Ex-Partner: No    Emotionally Abused: No    Physically  Abused: No    Sexually Abused: No    FAMILY HISTORY: Family History  Problem Relation Age of Onset   Heart disease Mother    Heart attack Mother    Cancer Father        bone   Heart disease Sister     ALLERGIES:  has No Known Allergies.  MEDICATIONS:  Current Outpatient Medications  Medication Sig Dispense Refill   atorvastatin (LIPITOR) 10 MG tablet TAKE 1 TABLET(10 MG) BY MOUTH AT BEDTIME. 90 tablet 1   levothyroxine (SYNTHROID) 50 MCG tablet Take 1 tablet (50 mcg total) by mouth daily before breakfast. 90 tablet 1   mirtazapine (REMERON) 15 MG tablet TAKE 1 TABLET(15 MG) BY MOUTH AT BEDTIME 90 tablet 1   omeprazole (PRILOSEC) 20 MG capsule Take 1 capsule (20 mg total) by mouth daily. 90 capsule 1   ferrous sulfate 325 (65 FE) MG EC tablet Take 1 tablet (325 mg total) by mouth daily with breakfast. 30 tablet 2   No current facility-administered medications for this visit.    REVIEW OF SYSTEMS:   Pertinent information mentioned in HPI All other systems were reviewed with the patient and are negative.  PHYSICAL EXAMINATION: ECOG PERFORMANCE STATUS: 1 - Symptomatic but completely ambulatory  Vitals:   08/15/22 1326 08/15/22 1327  BP:  (!) 146/77  Pulse:  86  Resp: 18 18  Temp:  97.9 F (36.6 C)    Filed Weights   08/15/22 1326  Weight: 110 lb 12.8 oz (50.3 kg)     GENERAL:alert, no distress and comfortable SKIN: skin color, texture, turgor are normal, no rashes or significant lesions EYES: normal, conjunctiva are pink and non-injected, sclera clear OROPHARYNX:no exudate, no erythema and lips, buccal mucosa, and tongue normal  NECK: supple, thyroid normal size, non-tender, without nodularity LYMPH:  no palpable lymphadenopathy in the cervical, axillary or inguinal LUNGS: clear to auscultation and percussion with normal breathing effort HEART: regular rate & rhythm and no murmurs and no lower extremity edema ABDOMEN: mass palpable in right upper quadrant. Rest  soft, non tender Musculoskeletal:no cyanosis of digits and no clubbing  PSYCH:  alert & oriented x 3 with fluent speech NEURO: no focal motor/sensory deficits  LABORATORY DATA:  I have reviewed the data as listed Lab Results  Component Value Date   WBC 8.5 06/19/2022   HGB 9.6 (L) 06/19/2022   HCT 30.7 (L) 06/19/2022   MCV 85 06/19/2022   PLT 343 06/19/2022   Recent Labs    01/04/22 1430 06/19/22 1558 06/29/22 1033  NA 139 140  --   K 4.9 5.0  --   CL 99 99  --   CO2 21 22  --   GLUCOSE 81 76  --   BUN 29* 38*  --   CREATININE 1.54* 1.70*  --   CALCIUM 10.7* 10.8*  --   PROT 7.7  --  8.2*  ALBUMIN 5.1*  --  4.8  AST 32  --  71*  ALT 25  --  63*  ALKPHOS 107  --  97  BILITOT 0.5  --  0.7  BILIDIR  --   --  0.1  IBILI  --   --  0.6    RADIOGRAPHIC STUDIES: I have personally reviewed the radiological images as listed and agreed with the findings in the report. No results found.

## 2022-08-15 NOTE — Progress Notes (Signed)
Patient here today for follow up regarding liver mass, biopsy results.

## 2022-08-17 ENCOUNTER — Ambulatory Visit: Payer: Medicare HMO | Admitting: Internal Medicine

## 2022-08-28 ENCOUNTER — Ambulatory Visit (INDEPENDENT_AMBULATORY_CARE_PROVIDER_SITE_OTHER): Payer: Medicare HMO | Admitting: Family Medicine

## 2022-08-28 ENCOUNTER — Ambulatory Visit: Payer: Medicare HMO

## 2022-08-28 ENCOUNTER — Encounter: Payer: Self-pay | Admitting: Family Medicine

## 2022-08-28 VITALS — BP 120/76 | HR 115 | Ht 61.0 in | Wt 110.0 lb

## 2022-08-28 DIAGNOSIS — M62838 Other muscle spasm: Secondary | ICD-10-CM

## 2022-08-28 MED ORDER — BACLOFEN 5 MG PO TABS: 1.0000 | ORAL_TABLET | Freq: Every evening | ORAL | 0 refills | Status: DC | PRN

## 2022-08-28 NOTE — Progress Notes (Signed)
     Primary Care / Sports Medicine Office Visit  Patient Information:  Patient ID: Michelle Novak, female DOB: [birth year] Age: 88 y.o. MRN: [id number]   Michelle Novak is a pleasant 88 y.o. female presenting with the following:  Chief Complaint  Patient presents with   Neck Pain    Woke up Thursday with neck pain radiates down to shoulders. Night time is worst.     Vitals:   08/28/22 1452  BP: 120/76  Pulse: (!) 115  SpO2: 99%   Vitals:   08/28/22 1452  Weight: 110 lb (49.9 kg)  Height: '5\' 1"'$  (1.549 m)   Body mass index is 20.78 kg/m.  No results found.   Independent interpretation of notes and tests performed by another provider:   None  Procedures performed:   None  Pertinent History, Exam, Impression, and Recommendations:   Problem List Items Addressed This Visit       Other   Cervical paraspinal muscle spasm - Primary    Acute on chronic neck and upper shoulder pain, right greater than left. Noted atraumatic onset last week after cutting shrubs / roots. Denies radiation into extremities or weakness, worst at night and first thing in AM, no paresthesias. Prior episodes resolved within 1-2 days, this has been more severe.   Exam with limited torsion, flexion and extension at cervical spine, negative Spurling's, symmetric and intact sensorimotor bilateral upper extremities, negative RC testing and impingement. She has tenderness diffusely about the right greater than left parapsinal musculature with focality to the trapezius.  Plan for moist heat, neck support, PRN baclofen 5 mg, and gentle ROM exercises. She is to contact for persistent symptoms at which point cervical spine x-rays, medication change, and trigger point injections to be considered.      Relevant Medications   Baclofen 5 MG TABS     Orders & Medications Meds ordered this encounter  Medications   Baclofen 5 MG TABS    Sig: Take 1 tablet by mouth at bedtime as needed.     Dispense:  14 tablet    Refill:  0   No orders of the defined types were placed in this encounter.    No follow-ups on file.     Montel Culver, MD   Primary Care Sports Medicine [place]

## 2022-08-28 NOTE — Assessment & Plan Note (Signed)
Acute on chronic neck and upper shoulder pain, right greater than left. Noted atraumatic onset last week after cutting shrubs / roots. Denies radiation into extremities or weakness, worst at night and first thing in AM, no paresthesias. Prior episodes resolved within 1-2 days, this has been more severe.   Exam with limited torsion, flexion and extension at cervical spine, negative Spurling's, symmetric and intact sensorimotor bilateral upper extremities, negative RC testing and impingement. Michelle Novak has tenderness diffusely about the right greater than left parapsinal musculature with focality to the trapezius.  Plan for moist heat, neck support, PRN baclofen 5 mg, and gentle ROM exercises. Michelle Novak is to contact for persistent symptoms at which point cervical spine x-rays, medication change, and trigger point injections to be considered.

## 2022-08-28 NOTE — Patient Instructions (Signed)
-   Use moist / humidified heat twice daily and as-needed - Can dose baclofen (muscle relaxer) nightly as-needed, side effect can be drowsiness - Perform gentle neck range of motion (side to side, up and down) as tolerated and work on advancing this daily - Hold from upper body tasks except for simple daily tasks (eating, drinking, getting dressed, etc) - Contact us if symptoms persist at 2-4 weeks

## 2022-09-01 ENCOUNTER — Telehealth: Payer: Self-pay

## 2022-09-01 NOTE — Telephone Encounter (Signed)
I spoke with patient daughter to give her an update on future appointments. MRI is scheduled for 09/08/2022 with a follow up with Dr.A on 09/15/2022. Patient daughter verbalized understanding.

## 2022-09-04 ENCOUNTER — Ambulatory Visit: Payer: Medicare HMO | Admitting: Internal Medicine

## 2022-09-04 ENCOUNTER — Other Ambulatory Visit: Payer: Medicare HMO

## 2022-09-08 ENCOUNTER — Ambulatory Visit
Admission: RE | Admit: 2022-09-08 | Discharge: 2022-09-08 | Disposition: A | Discharge status: Home / Self Care | Payer: Medicare HMO | Source: Ambulatory Visit | Attending: Internal Medicine | Admitting: Internal Medicine

## 2022-09-08 DIAGNOSIS — K7689 Other specified diseases of liver: Secondary | ICD-10-CM | POA: Diagnosis not present

## 2022-09-08 DIAGNOSIS — R160 Hepatomegaly, not elsewhere classified: Secondary | ICD-10-CM | POA: Diagnosis not present

## 2022-09-08 DIAGNOSIS — I700 Atherosclerosis of aorta: Secondary | ICD-10-CM | POA: Diagnosis not present

## 2022-09-08 DIAGNOSIS — N281 Cyst of kidney, acquired: Secondary | ICD-10-CM | POA: Diagnosis not present

## 2022-09-08 MED ORDER — GADOBUTROL 1 MMOL/ML IV SOLN
5.0000 mL | Freq: Once | INTRAVENOUS | Status: AC | PRN
  Administered 2022-09-08: 5 mL via INTRAVENOUS

## 2022-09-15 ENCOUNTER — Encounter: Payer: Self-pay | Admitting: Internal Medicine

## 2022-09-15 ENCOUNTER — Inpatient Hospital Stay: Payer: Medicare HMO | Admitting: Internal Medicine

## 2022-09-15 ENCOUNTER — Inpatient Hospital Stay: Payer: Medicare HMO | Attending: Internal Medicine

## 2022-09-15 VITALS — BP 132/80 | HR 99 | Temp 96.6°F | Resp 19 | Wt 107.6 lb

## 2022-09-15 DIAGNOSIS — K219 Gastro-esophageal reflux disease without esophagitis: Secondary | ICD-10-CM | POA: Diagnosis not present

## 2022-09-15 DIAGNOSIS — E785 Hyperlipidemia, unspecified: Secondary | ICD-10-CM | POA: Insufficient documentation

## 2022-09-15 DIAGNOSIS — Z79899 Other long term (current) drug therapy: Secondary | ICD-10-CM | POA: Insufficient documentation

## 2022-09-15 DIAGNOSIS — Z79890 Hormone replacement therapy (postmenopausal): Secondary | ICD-10-CM | POA: Diagnosis not present

## 2022-09-15 DIAGNOSIS — R160 Hepatomegaly, not elsewhere classified: Secondary | ICD-10-CM | POA: Diagnosis not present

## 2022-09-15 DIAGNOSIS — E039 Hypothyroidism, unspecified: Secondary | ICD-10-CM | POA: Diagnosis not present

## 2022-09-15 DIAGNOSIS — K838 Other specified diseases of biliary tract: Secondary | ICD-10-CM | POA: Diagnosis not present

## 2022-09-15 DIAGNOSIS — I129 Hypertensive chronic kidney disease with stage 1 through stage 4 chronic kidney disease, or unspecified chronic kidney disease: Secondary | ICD-10-CM | POA: Diagnosis not present

## 2022-09-15 DIAGNOSIS — K769 Liver disease, unspecified: Secondary | ICD-10-CM | POA: Diagnosis not present

## 2022-09-15 DIAGNOSIS — N1832 Chronic kidney disease, stage 3b: Secondary | ICD-10-CM | POA: Diagnosis not present

## 2022-09-15 DIAGNOSIS — D649 Anemia, unspecified: Secondary | ICD-10-CM | POA: Diagnosis not present

## 2022-09-15 DIAGNOSIS — Z808 Family history of malignant neoplasm of other organs or systems: Secondary | ICD-10-CM | POA: Diagnosis not present

## 2022-09-15 DIAGNOSIS — D631 Anemia in chronic kidney disease: Secondary | ICD-10-CM | POA: Diagnosis not present

## 2022-09-15 DIAGNOSIS — M810 Age-related osteoporosis without current pathological fracture: Secondary | ICD-10-CM | POA: Diagnosis not present

## 2022-09-15 DIAGNOSIS — Z8249 Family history of ischemic heart disease and other diseases of the circulatory system: Secondary | ICD-10-CM | POA: Diagnosis not present

## 2022-09-15 DIAGNOSIS — D508 Other iron deficiency anemias: Secondary | ICD-10-CM

## 2022-09-15 LAB — HEPATIC FUNCTION PANEL
ALT: 39 U/L (ref 0–44)
AST: 36 U/L (ref 15–41)
Albumin: 3.8 g/dL (ref 3.5–5.0)
Alkaline Phosphatase: 104 U/L (ref 38–126)
Bilirubin, Direct: 0.1 mg/dL (ref 0.0–0.2)
Indirect Bilirubin: 0.3 mg/dL (ref 0.3–0.9)
Total Bilirubin: 0.4 mg/dL (ref 0.3–1.2)
Total Protein: 7.9 g/dL (ref 6.5–8.1)

## 2022-09-15 LAB — CBC WITH DIFFERENTIAL/PLATELET
Abs Immature Granulocytes: 0.03 10*3/uL (ref 0.00–0.07)
Basophils Absolute: 0 10*3/uL (ref 0.0–0.1)
Basophils Relative: 1 %
Eosinophils Absolute: 0.1 10*3/uL (ref 0.0–0.5)
Eosinophils Relative: 1 %
HCT: 30.8 % — ABNORMAL LOW (ref 36.0–46.0)
Hemoglobin: 9.6 g/dL — ABNORMAL LOW (ref 12.0–15.0)
Immature Granulocytes: 0 %
Lymphocytes Relative: 40 %
Lymphs Abs: 3.5 10*3/uL (ref 0.7–4.0)
MCH: 26.8 pg (ref 26.0–34.0)
MCHC: 31.2 g/dL (ref 30.0–36.0)
MCV: 86 fL (ref 80.0–100.0)
Monocytes Absolute: 0.6 10*3/uL (ref 0.1–1.0)
Monocytes Relative: 7 %
Neutro Abs: 4.4 10*3/uL (ref 1.7–7.7)
Neutrophils Relative %: 51 %
Platelets: 345 10*3/uL (ref 150–400)
RBC: 3.58 MIL/uL — ABNORMAL LOW (ref 3.87–5.11)
RDW: 14.1 % (ref 11.5–15.5)
WBC: 8.8 10*3/uL (ref 4.0–10.5)
nRBC: 0 % (ref 0.0–0.2)

## 2022-09-15 LAB — IRON AND TIBC
Iron: 23 ug/dL — ABNORMAL LOW (ref 28–170)
Saturation Ratios: 8 % — ABNORMAL LOW (ref 10.4–31.8)
TIBC: 287 ug/dL (ref 250–450)
UIBC: 264 ug/dL

## 2022-09-15 LAB — FERRITIN: Ferritin: 379 ng/mL — ABNORMAL HIGH (ref 11–307)

## 2022-09-15 NOTE — Progress Notes (Signed)
[facility] NOTE  Patient Care Team: Glean Hess, MD as PCP - General (Internal Medicine) Clent Jacks, RN as Oncology Nurse Navigator  REFERRING PROVIDER: Dr. Army Melia  REASON FOR REFFERAL: liver masses   ASSESSMENT & PLAN:  Michelle Novak is a 88 yo F with pmh of HLD, osteoporosis and hypothyroidism was referred to Oncology clinic for further work up of liver masses.   #Liver masses -CT abdomen pelvis wo contrast done on 06/28/2019 by PCP due to palpable mass on exam. showed multiple liver masses with largest measuring 13 cm concerning for metastatic disease, sclerosis of L1 and sacrum which is non-specific versus metastasis.  - PET CT scan from 07/14/2022 showed hypermetabolic liver masses largest measuring 8.6 x 12 cm in the right hepatic lobe with SUV max 12.1.  No other areas of hypermetabolic activity were seen.  - AFP, CEA and CA 19-9 normal.   - US guided liver biopsy done at [facility] on 08/04/2022.  Pathology showed atypical clusters of hepatocytes, large amount of blood and organizing fibrin more likely to be hepatocellular adenoma, however carcinoma cannot be completely ruled out.  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 This was discussed with Dr. Roma Kayser, pathology at [facility].  -MRI liver with and without contrast on 09/08/2022 showed decrease in the size of the dominant mass from 13.3 x 9.3 cm to 10.9 x 6.7 cm.  It showed heterogeneous avid arterial and portal enhancement.  2 smaller lesion in segment 8 is also decreased in size.  Lesion in segment 7 is unchanged.  Due to decreasing size (showed, there is a possibility that they represent hepatic adenoma.  However the dominant mass has been markedly heterogeneous and enhancing.  So, repeat biopsy or close imaging follow-up was advised.  Discussed with the patient and daughters in detail about the MRI finding result.  Consideration of repeat  close follow-up in 2 months versus repeat biopsy.  Patient reported that she has been tired with multiple imaging and interventions.  She would like some time to think the next steps and will let me know.  # Normocytic anemia  # CKD Stage 3b - secondary to anemia of chronic disease with component of functional iron deficiency. -Patient has been taking iron for 3 months now.  Repeat iron studies pending today. - EPO level is low at 15.  Can consider EPO injections down the line. - b12 level normal    No orders of the defined types were placed in this encounter.  RTC in 8 weeks for MD visit  The total time spent in the appointment was 35 minutes encounter with patients including review of chart and various tests results, discussions about plan of care and coordination of care plan   All questions were answered. The patient knows to call the clinic with any problems, questions or concerns. No barriers to learning was detected.  Michelle Canary, MD 11/10/20238:47 PM   HISTORY OF PRESENTING ILLNESS:  Michelle Novak 88 y.o. female with pmh of HLD, osteoporosis and hypothyroidism was referred to Oncology clinic for further work up of liver masses.   Patient was seen by Dr. Army Melia, PCP for routine visit on 06/19/2022.  Patient reports on the exam of her abdomen her doctor felt something abnormal.  CT abdomen pelvis was done on 06/27/2022 which showed multiple liver masses with largest measuring 13 cm concerning for metastatic disease, sclerosis of L1 and sacrum which is non-specific versus  metastasis.  PET CT scan from 07/14/2022 showed hypermetabolic liver masses largest measuring 8.6 x 12 cm in the right hepatic lobe with SUV max 12.1.  No other areas of hypermetabolic activity were seen.  AFP, CEA and CA 19-9 normal.  US guided liver biopsy done at [facility] on 08/04/2022.  Pathology showed atypical clusters of hepatocytes, large amount of blood and organizing fibrin more likely to be hepatocellular  adenoma, however carcinoma cannot be completely ruled out.  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  This was discussed with Dr. Roma Kayser, pathology at [facility].   INTERVAL HISTORY-  Patient was seen today with her daughter to discuss mri liver results.  She continues to feel well.  Denies any pain, nausea, vomiting.  Appetite is fair. Tolerating iron well.   I have reviewed her chart and materials related to her cancer extensively and collaborated history with the patient.  MEDICAL HISTORY:  Past Medical History:  Diagnosis Date   Arthritis    Chronic kidney disease    Esophageal reflux    Heart murmur    History of abnormal cervical Pap smear    Hyperlipidemia    Hypertension    Hypothyroidism    Osteoporosis     SURGICAL HISTORY: Past Surgical History:  Procedure Laterality Date   ABDOMINAL HYSTERECTOMY     JOINT REPLACEMENT      SOCIAL HISTORY: Social History   Socioeconomic History   Marital status: Widowed    Spouse name: Not on file   Number of children: Not on file   Years of education: 12   Highest education level: 12th grade  Occupational History   Not on file  Tobacco Use   Smoking status: Never   Smokeless tobacco: Never  Vaping Use   Vaping Use: Never used  Substance and Sexual Activity   Alcohol use: No   Drug use: No   Sexual activity: Not on file  Other Topics Concern   Not on file  Social History Narrative   Not on file   Social Determinants of Health   Financial Resource Strain: Low Risk  (06/19/2022)   Overall Financial Resource Strain (CARDIA)    Difficulty of Paying Living Expenses: Not hard at all  Food Insecurity: No Food Insecurity (06/19/2022)   Hunger Vital Sign    Worried About Running Out of Food in the Last Year: Never true    [place] in the Last Year: Never true  Transportation Needs: No Transportation Needs (06/19/2022)   PRAPARE - Armed forces logistics/support/administrative officer (Medical): No    Lack of Transportation (Non-Medical): No  Physical Activity: Inactive (07/05/2022)   Exercise Vital Sign    Days of Exercise per Week: 0 days    Minutes of Exercise per Session: 0 min  Stress: No Stress Concern Present (07/05/2022)   [place]    Feeling of Stress : Not at all  Social Connections: Moderately Integrated (07/05/2022)   Social Connection and Isolation Panel [NHANES]    Frequency of Communication with Friends and Family: More than three times a week    Frequency of Social Gatherings with Friends and Family: Three times a week    Attends Religious Services: More than 4 times per year    Active Member of Clubs or Organizations: Yes    Attends Archivist Meetings: More than 4 times per year  Marital Status: Widowed  Intimate Partner Violence: Not At Risk (06/19/2022)   Humiliation, Afraid, Rape, and Kick questionnaire    Fear of Current or Ex-Partner: No    Emotionally Abused: No    Physically Abused: No    Sexually Abused: No    FAMILY HISTORY: Family History  Problem Relation Age of Onset   Heart disease Mother    Heart attack Mother    Cancer Father        bone   Heart disease Sister     ALLERGIES:  has No Known Allergies.  MEDICATIONS:  Current Outpatient Medications  Medication Sig Dispense Refill   atorvastatin (LIPITOR) 10 MG tablet TAKE 1 TABLET(10 MG) BY MOUTH AT BEDTIME. 90 tablet 1   ferrous sulfate 325 (65 FE) MG EC tablet Take 1 tablet (325 mg total) by mouth daily with breakfast. 30 tablet 2   levothyroxine (SYNTHROID) 50 MCG tablet Take 1 tablet (50 mcg total) by mouth daily before breakfast. 90 tablet 1   mirtazapine (REMERON) 15 MG tablet TAKE 1 TABLET(15 MG) BY MOUTH AT BEDTIME 90 tablet 1   omeprazole (PRILOSEC) 20 MG capsule Take 1 capsule (20 mg total) by mouth daily. 90 capsule 1   Baclofen 5 MG TABS Take 1 tablet by mouth at  bedtime as needed. (Patient not taking: Reported on 09/15/2022) 14 tablet 0   No current facility-administered medications for this visit.    REVIEW OF SYSTEMS:   Pertinent information mentioned in HPI All other systems were reviewed with the patient and are negative.  PHYSICAL EXAMINATION: ECOG PERFORMANCE STATUS: 1 - Symptomatic but completely ambulatory  Vitals:   09/15/22 1521  BP: 132/80  Pulse: 99  Resp: 19  Temp: (!) 96.6 F (35.9 C)  SpO2: 99%    Filed Weights   09/15/22 1521  Weight: 107 lb 9.6 oz (48.8 kg)     GENERAL:alert, no distress and comfortable SKIN: skin color, texture, turgor are normal, no rashes or significant lesions EYES: normal, conjunctiva are pink and non-injected, sclera clear OROPHARYNX:no exudate, no erythema and lips, buccal mucosa, and tongue normal  NECK: supple, thyroid normal size, non-tender, without nodularity LYMPH:  no palpable lymphadenopathy in the cervical, axillary or inguinal LUNGS: clear to auscultation and percussion with normal breathing effort HEART: regular rate & rhythm and no murmurs and no lower extremity edema ABDOMEN: mass palpable in right upper quadrant. Rest soft, non tender Musculoskeletal:no cyanosis of digits and no clubbing  PSYCH: alert & oriented x 3 with fluent speech NEURO: no focal motor/sensory deficits  LABORATORY DATA:  I have reviewed the data as listed Lab Results  Component Value Date   WBC 8.8 09/15/2022   HGB 9.6 (L) 09/15/2022   HCT 30.8 (L) 09/15/2022   MCV 86.0 09/15/2022   PLT 345 09/15/2022   Recent Labs    01/04/22 1430 06/19/22 1558 06/29/22 1033 09/15/22 1459  NA 139 140  --   --   K 4.9 5.0  --   --   CL 99 99  --   --   CO2 21 22  --   --   GLUCOSE 81 76  --   --   BUN 29* 38*  --   --   CREATININE 1.54* 1.70*  --   --   CALCIUM 10.7* 10.8*  --   --   PROT 7.7  --  8.2* 7.9  ALBUMIN 5.1*  --  4.8 3.8  AST 32  --  71*  36  ALT 25  --  63* 39  ALKPHOS 107  --  97 104   BILITOT 0.5  --  0.7 0.4  BILIDIR  --   --  0.1 0.1  IBILI  --   --  0.6 0.3    RADIOGRAPHIC STUDIES: I have personally reviewed the radiological images as listed and agreed with the findings in the report. MR LIVER W WO CONTRAST  Result Date: 09/11/2022 CLINICAL DATA:  Multiple bulky hypermetabolic liver masses. Ultrasound-guided liver biopsy performed at an outside facility on 08/04/2022 favoring hepatocellular adenoma with carcinoma not completely excluded. EXAM: MRI ABDOMEN WITHOUT AND WITH CONTRAST TECHNIQUE: Multiplanar multisequence MR imaging of the abdomen was performed both before and after the administration of intravenous contrast. CONTRAST:  63m GADAVIST GADOBUTROL 1 MMOL/ML IV SOLN COMPARISON:  07/13/2022 PET-CT. 06/27/2022 unenhanced CT abdomen/pelvis. FINDINGS: Lower chest: No acute abnormality at the lung bases. Hepatobiliary: No hepatic steatosis. Multiple liver masses as follows, none of which demonstrate internal fat signal: Dominant 10.9 x 6.7 cm liver mass centered in segment 5 right liver lobe (series 21/image 45) with heterogeneous avid arterial and portal enhancement and heterogeneous T1 and T2 signal intensity including internal T1 hyperintense hemorrhagic foci throughout the mass, which appears decreased from 13.3 x 9.3 cm on 06/27/2022 CT abdomen study. Two similar mildly T2 hyperintense segment 8 right liver masses demonstrate arterial and portal hyperenhancement and have also clearly decreased in size since 06/27/2022 CT abdomen study, measuring 1.4 x 1.3 cm superiorly (series 13/image 24), decreased from 3.8 x 3.0 cm on 06/27/2022 CT, and measuring 1.8 x 1.2 cm more inferiorly (series 13/image 34), decreased from 2.6 x 1.9 cm on 06/27/2022 CT. Heterogeneous 1.7 x 1.0 cm subcapsular peripheral segment 7 right liver lesion (series 13/image 26) with scattered internal T2 hyperintense foci and with heterogeneous enhancement, not appreciably changed in size from 06/27/2022 CT  abdomen study where it measured 1.8 x 1.1 cm. No new liver lesions. Normal gallbladder with no cholelithiasis. Mild intrahepatic biliary ductal dilatation in the inferior right liver peripheral to the dominant liver mass (series 4/image 22). Normal common bile duct diameter 5 mm. No evidence of choledocholithiasis. Pancreas: No pancreatic mass or duct dilation.  No pancreas divisum. Spleen: Normal size. No mass. Adrenals/Urinary Tract: Normal adrenals. No hydronephrosis. Scattered simple renal cysts bilaterally, largest 1.7 cm in the lower right kidney, for which no follow-up imaging is recommended. No suspicious renal masses. Stomach/Bowel: Normal non-distended stomach. Visualized small and large bowel is normal caliber, with no bowel wall thickening. Vascular/Lymphatic: Atherosclerotic nonaneurysmal abdominal aorta. No pathologically enlarged lymph nodes in the abdomen. Other: No abdominal ascites or focal fluid collection. Musculoskeletal: No aggressive appearing focal osseous lesions. IMPRESSION: 1. Dominant enhancing 10.9 cm liver mass centered in segment 5, clearly decreased in size from 13.3 cm on baseline 06/27/2022 CT abdomen study. Two additional similar smaller enhancing segment 8 right liver masses measuring 1.8 cm and 1.4 cm have also clearly decreased in size since 06/27/2022 CT abdomen study. An enhancing small 1.7 cm subcapsular peripheral segment 7 right liver mass is stable since 06/27/2022 CT. No new or enlarging liver masses. These masses are nonspecific by MRI. Given the biopsy results, they may represent hepatic adenomas that are decreasing in size. However, the dominant mass is markedly heterogeneous and avidly enhancing and malignant degeneration remains a concern. Management considerations include close imaging follow-up and/or repeat percutaneous biopsy. 2. Mild intrahepatic biliary ductal dilatation in the inferior right liver peripheral to the dominant liver  mass. Common bile duct is  normal caliber. 3. No abdominal lymphadenopathy. Electronically Signed   By: Ilona Sorrel M.D.   On: 09/11/2022 13:23

## 2022-09-18 ENCOUNTER — Ambulatory Visit: Payer: Self-pay | Admitting: Dermatology

## 2022-11-10 ENCOUNTER — Inpatient Hospital Stay: Payer: Medicare HMO | Admitting: Internal Medicine

## 2022-12-16 ENCOUNTER — Other Ambulatory Visit: Payer: Self-pay | Admitting: Internal Medicine

## 2022-12-16 DIAGNOSIS — F39 Unspecified mood [affective] disorder: Secondary | ICD-10-CM

## 2022-12-16 DIAGNOSIS — E7800 Pure hypercholesterolemia, unspecified: Secondary | ICD-10-CM

## 2022-12-16 DIAGNOSIS — E039 Hypothyroidism, unspecified: Secondary | ICD-10-CM

## 2022-12-18 NOTE — Telephone Encounter (Signed)
Requested Prescriptions  Pending Prescriptions Disp Refills   atorvastatin (LIPITOR) 10 MG tablet [Pharmacy Med Name: ATORVASTATIN 10MG TABLETS] 90 tablet 1    Sig: TAKE 1 TABLET(10 MG) BY MOUTH AT BEDTIME     Cardiovascular:  Antilipid - Statins Failed - 12/16/2022  6:22 AM      Failed - Lipid Panel in normal range within the last 12 months    Cholesterol, Total  Date Value Ref Range Status  01/04/2022 205 (H) 100 - 199 mg/dL Final   Cholesterol Piccolo, Waived  Date Value Ref Range Status  06/04/2019 200 (H) <200 mg/dL Final    Comment:                            Desirable                <200                         Borderline High      200- 239                         High                     >239    LDL Chol Calc (NIH)  Date Value Ref Range Status  01/04/2022 137 (H) 0 - 99 mg/dL Final   HDL  Date Value Ref Range Status  01/04/2022 45 >39 mg/dL Final   Triglycerides  Date Value Ref Range Status  01/04/2022 129 0 - 149 mg/dL Final   Triglycerides Piccolo,Waived  Date Value Ref Range Status  06/04/2019 106 <150 mg/dL Final    Comment:                            Normal                   <150                         Borderline High     150 - 199                         High                200 - 499                         Very High                >499          Passed - Patient is not pregnant      Passed - Valid encounter within last 12 months    Recent Outpatient Visits           3 months ago Cervical paraspinal muscle spasm   [place] [facility] at [place], Earley Abide, MD   6 months ago Essential hypertension    [facility] at [facility], Jesse Sans, MD   9 months ago Essential hypertension   [place] at [facility], Jesse Sans, MD   11 months ago Essential hypertension   [place]  at [facility], Jesse Sans, MD   3 years ago Annual physical exam   [place], [place], [place]               levothyroxine (SYNTHROID) 50 MCG tablet [Pharmacy Med Name: LEVOTHYROXINE 0.05MG (50MCG) TAB] 90 tablet 1    Sig: TAKE 1 TABLET(50 MCG) BY MOUTH DAILY BEFORE BREAKFAST     Endocrinology:  Hypothyroid Agents Passed - 12/16/2022  6:22 AM      Passed - TSH in normal range and within 360 days    TSH  Date Value Ref Range Status  01/04/2022 0.645 0.450 - 4.500 uIU/mL Final         Passed - Valid encounter within last 12 months    Recent Outpatient Visits           3 months ago Cervical paraspinal muscle spasm   [place] [facility] at [place], Earley Abide, MD   6 months ago Essential hypertension   [place] at [facility], Jesse Sans, MD   9 months ago Essential hypertension   [place] at [facility], Jesse Sans, MD   11 months ago Essential hypertension   [place] at [facility], Jesse Sans, MD   3 years ago Annual physical exam   [place] [facility] [place], [place]               mirtazapine ([place]) 15 MG tablet [Pharmacy Med Name: MIRTAZAPINE 15MG TABLETS] 90 tablet 1    Sig: TAKE 1 TABLET(15 MG) BY MOUTH AT BEDTIME     Psychiatry: Antidepressants - mirtazapine Passed - 12/16/2022  6:22 AM      Passed - Valid encounter within last 6 months    Recent Outpatient Visits           3 months ago Cervical paraspinal muscle spasm    [facility] at [place], Earley Abide, MD   6 months ago Essential hypertension   [place] at [facility], Jesse Sans, MD   9 months ago Essential hypertension   [place] at [facility], Jesse Sans, MD   11 months ago Essential hypertension   [place] at [facility], Jesse Sans, MD   3 years ago Annual physical exam   [place], [place], [place]

## 2023-03-16 ENCOUNTER — Other Ambulatory Visit: Payer: Self-pay | Admitting: Internal Medicine

## 2023-03-16 DIAGNOSIS — E7800 Pure hypercholesterolemia, unspecified: Secondary | ICD-10-CM

## 2023-03-16 DIAGNOSIS — E039 Hypothyroidism, unspecified: Secondary | ICD-10-CM

## 2023-03-16 DIAGNOSIS — F39 Unspecified mood [affective] disorder: Secondary | ICD-10-CM

## 2023-03-16 NOTE — Telephone Encounter (Signed)
Requested Prescriptions  Pending Prescriptions Disp Refills   atorvastatin (LIPITOR) 10 MG tablet [Pharmacy Med Name: ATORVASTATIN 10MG  TABLETS] 90 tablet 0    Sig: TAKE 1 TABLET(10 MG) BY MOUTH AT BEDTIME     Cardiovascular:  Antilipid - Statins Failed - 03/16/2023  6:22 AM      Failed - Lipid Panel in normal range within the last 12 months    Cholesterol, Total  Date Value Ref Range Status  01/04/2022 205 (H) 100 - 199 mg/dL Final   Cholesterol Piccolo, Waived  Date Value Ref Range Status  06/04/2019 200 (H) <200 mg/dL Final    Comment:                            Desirable                <200                         Borderline High      200- 239                         High                     >239    LDL Chol Calc (NIH)  Date Value Ref Range Status  01/04/2022 137 (H) 0 - 99 mg/dL Final   HDL  Date Value Ref Range Status  01/04/2022 45 >39 mg/dL Final   Triglycerides  Date Value Ref Range Status  01/04/2022 129 0 - 149 mg/dL Final   Triglycerides Piccolo,Waived  Date Value Ref Range Status  06/04/2019 106 <150 mg/dL Final    Comment:                            Normal                   <150                         Borderline High     150 - 199                         High                200 - 499                         Very High                >499          Passed - Patient is not pregnant      Passed - Valid encounter within last 12 months    Recent Outpatient Visits           6 months ago Cervical paraspinal muscle spasm   [place] [facility] at [facility], Ocie Bob, MD   9 months ago Essential hypertension   [place] [facility] at [facility], Nyoka Cowden, MD   1 year ago Essential hypertension   [place] [facility] at [facility], Nyoka Cowden, MD   1 year ago Essential hypertension   [place] [facility]  at [facility], Nyoka Cowden, MD   3 years ago Annual physical exam   [facility] Roosvelt Maser [place], [place]               levothyroxine (SYNTHROID) 50 MCG tablet [Pharmacy Med Name: LEVOTHYROXINE 0.05MG  ( ) TAB] 90 tablet 0    Sig: TAKE 1 TABLET(50 MCG) BY MOUTH DAILY BEFORE BREAKFAST     Endocrinology:  Hypothyroid Agents Failed - 03/16/2023  6:22 AM      Failed - TSH in normal range and within 360 days    TSH  Date Value Ref Range Status  01/04/2022 0.645 0.450 - 4.500 uIU/mL Final         Passed - Valid encounter within last 12 months    Recent Outpatient Visits           6 months ago Cervical paraspinal muscle spasm   [place] [facility], Ocie Bob, MD   9 months ago Essential hypertension   [place] [facility] at [facility], Nyoka Cowden, MD   1 year ago Essential hypertension   [place] [facility] at [facility], Nyoka Cowden, MD   1 year ago Essential hypertension   [place] [facility] at [facility], Nyoka Cowden, MD   3 years ago Annual physical exam   [place] [facility] Particia [place], [place]               mirtazapine (REMERON) 15 MG tablet [Pharmacy Med Name: MIRTAZAPINE 15MG  TABLETS] 90 tablet 0    Sig: TAKE 1 TABLET(15 MG) BY MOUTH AT BEDTIME     Psychiatry: Antidepressants - mirtazapine Failed - 03/16/2023  6:22 AM      Failed - Valid encounter within last 6 months    Recent Outpatient Visits           6 months ago Cervical paraspinal muscle spasm   [place] [facility] at [facility], Ocie Bob, MD   9 months ago Essential hypertension   [place] [facility] at [facility], Nyoka Cowden, MD   1 year ago Essential hypertension   [place] [facility] at [facility], Nyoka Cowden, MD   1 year ago Essential hypertension   [place] [facility] at [facility], Nyoka Cowden, MD   3 years ago Annual physical exam   [facility] Particia [place], [place]

## 2023-04-08 ENCOUNTER — Other Ambulatory Visit: Payer: Self-pay | Admitting: Internal Medicine

## 2023-04-08 DIAGNOSIS — K219 Gastro-esophageal reflux disease without esophagitis: Secondary | ICD-10-CM

## 2023-04-11 ENCOUNTER — Ambulatory Visit (INDEPENDENT_AMBULATORY_CARE_PROVIDER_SITE_OTHER): Payer: Medicare HMO | Admitting: Internal Medicine

## 2023-04-11 ENCOUNTER — Encounter: Payer: Self-pay | Admitting: Internal Medicine

## 2023-04-11 VITALS — BP 126/72 | HR 89 | Ht 61.0 in | Wt 121.8 lb

## 2023-04-11 DIAGNOSIS — I700 Atherosclerosis of aorta: Secondary | ICD-10-CM

## 2023-04-11 DIAGNOSIS — E039 Hypothyroidism, unspecified: Secondary | ICD-10-CM

## 2023-04-11 DIAGNOSIS — E7800 Pure hypercholesterolemia, unspecified: Secondary | ICD-10-CM | POA: Diagnosis not present

## 2023-04-11 DIAGNOSIS — N1832 Chronic kidney disease, stage 3b: Secondary | ICD-10-CM | POA: Diagnosis not present

## 2023-04-11 DIAGNOSIS — F39 Unspecified mood [affective] disorder: Secondary | ICD-10-CM

## 2023-04-11 DIAGNOSIS — K219 Gastro-esophageal reflux disease without esophagitis: Secondary | ICD-10-CM

## 2023-04-11 DIAGNOSIS — D134 Benign neoplasm of liver: Secondary | ICD-10-CM

## 2023-04-11 NOTE — Assessment & Plan Note (Signed)
Reflux symptoms are minimal on current therapy - omeprazole. No red flag signs such as weight loss, n/v, melena She has gained some weight since last year

## 2023-04-11 NOTE — Progress Notes (Signed)
Date:  04/11/2023   Name:  Michelle Novak   DOB:  11-29-1931   MRN:  [id number]   Chief Complaint: Hypertension, Hypothyroidism, and Hyperlipidemia  Hypertension The problem has been resolved since onset. Pertinent negatives include no chest pain, headaches, palpitations or shortness of breath. Hypertensive end-organ damage includes kidney disease. Identifiable causes of hypertension include a thyroid problem.  Thyroid Problem Presents for follow-up visit. Symptoms include constipation. Patient reports no anxiety, fatigue, heat intolerance, leg swelling, palpitations or weight loss. The symptoms have been stable. Her past medical history is significant for hyperlipidemia.  Hyperlipidemia This is a chronic problem. The problem is controlled. Pertinent negatives include no chest pain or shortness of breath. Current antihyperlipidemic treatment includes statins. The current treatment provides significant improvement of lipids.  Weight loss/decreased appetite - she is doing well on Remeron and Ensure daily.  Energy level is good. Hepatic masses - complete workup last year.  Biopsy inconclusive but suggestive of adenoma.  Her last MRI  11/23 showed that three of the masses were smaller in size.  Oncology recommended repeat biopsy vs repeat MRI in 2 months.  She decided to do neither since she feels well.   Lab Results  Component Value Date   NA 140 06/19/2022   K 5.0 06/19/2022   CO2 22 06/19/2022   GLUCOSE 76 06/19/2022   BUN 38 (H) 06/19/2022   CREATININE 1.70 (H) 06/19/2022   CALCIUM 10.8 (H) 06/19/2022   EGFR 29 (L) 06/19/2022   GFRNONAA 25 (L) 11/27/2019   Lab Results  Component Value Date   CHOL 205 (H) 01/04/2022   HDL 45 01/04/2022   LDLCALC 137 (H) 01/04/2022   TRIG 129 01/04/2022   CHOLHDL 4.6 (H) 01/04/2022   Lab Results  Component Value Date   TSH 0.645 01/04/2022   No results found for: "HGBA1C" Lab Results  Component Value Date   WBC 8.8 09/15/2022   HGB  9.6 (L) 09/15/2022   HCT 30.8 (L) 09/15/2022   MCV 86.0 09/15/2022   PLT 345 09/15/2022   Lab Results  Component Value Date   ALT 39 09/15/2022   AST 36 09/15/2022   ALKPHOS 104 09/15/2022   BILITOT 0.4 09/15/2022   No results found for: "25OHVITD2", "25OHVITD3", "VD25OH"   Review of Systems  Constitutional:  Negative for chills, fatigue, fever, unexpected weight change (has gained back her weight loss from last year) and weight loss.  HENT:  Negative for trouble swallowing.   Respiratory:  Negative for chest tightness and shortness of breath.   Cardiovascular:  Negative for chest pain and palpitations.  Gastrointestinal:  Positive for constipation. Negative for abdominal pain, nausea and vomiting.  Endocrine: Negative for heat intolerance.  Genitourinary:  Negative for dysuria.  Musculoskeletal:  Negative for arthralgias and gait problem.  Neurological:  Negative for dizziness, light-headedness and headaches.  Psychiatric/Behavioral:  Negative for dysphoric mood and sleep disturbance. The patient is not nervous/anxious.     Patient Active Problem List   Diagnosis Date Noted   Hepatic adenoma 04/11/2023   Mood disorder (HCC) 04/11/2023   Atherosclerosis of aorta (HCC) 04/11/2023   Cervical paraspinal muscle spasm 08/28/2022   Anemia 06/22/2022   Chronic kidney disease, stage 3b (HCC) 02/14/2021   Bradycardia 05/15/2018   Advanced care planning/counseling discussion 11/15/2017   Seborrheic keratosis 11/15/2017   Palpitations 03/30/2016   Acquired hypothyroidism    Osteoporosis    Hyperlipidemia    Esophageal reflux    Heart murmur  No Known Allergies  Past Surgical History:  Procedure Laterality Date   ABDOMINAL HYSTERECTOMY     JOINT REPLACEMENT      Social History   Tobacco Use   Smoking status: Never   Smokeless tobacco: Never  Vaping Use   Vaping Use: Never used  Substance Use Topics   Alcohol use: No   Drug use: No     Medication list has been  reviewed and updated.  Current Meds  Medication Sig   atorvastatin (LIPITOR) 10 MG tablet TAKE 1 TABLET(10 MG) BY MOUTH AT BEDTIME   ferrous sulfate 325 (65 FE) MG EC tablet Take 1 tablet (325 mg total) by mouth daily with breakfast.   levothyroxine (SYNTHROID) 50 MCG tablet TAKE 1 TABLET(50 MCG) BY MOUTH DAILY BEFORE BREAKFAST   mirtazapine (REMERON) 15 MG tablet TAKE 1 TABLET(15 MG) BY MOUTH AT BEDTIME   omeprazole (PRILOSEC) 20 MG capsule TAKE 1 CAPSULE(20 MG) BY MOUTH DAILY       04/11/2023   10:39 AM 06/19/2022    3:09 PM 03/15/2022   11:05 AM 01/04/2022    1:54 PM  GAD 7 : Generalized Anxiety Score  Nervous, Anxious, on Edge 0 0 0 0  Control/stop worrying 0 0 0 1  Worry too much - different things 0 0 0 0  Trouble relaxing 0 0 0 1  Restless 0 0 0 0  Easily annoyed or irritable 0 1 0 0  Afraid - awful might happen 0 0 0 1  Total GAD 7 Score 0 1 0 3  Anxiety Difficulty Not difficult at all Not difficult at all Not difficult at all Not difficult at all       04/11/2023   10:38 AM 07/05/2022    3:34 PM 06/19/2022    3:09 PM  Depression screen PHQ 2/9  Decreased Interest 0 1 1  Down, Depressed, Hopeless 0 1 1  PHQ - 2 Score 0 2 2  Altered sleeping 0 0 0  Tired, decreased energy 1 0 2  Change in appetite 0 0 2  Feeling bad or failure about yourself  0 0 0  Trouble concentrating 0 0 0  Moving slowly or fidgety/restless 0 0 0  Suicidal thoughts 0 0 0  PHQ-9 Score 1 2 6   Difficult doing work/chores Not difficult at all Not difficult at all Not difficult at all    BP Readings from Last 3 Encounters:  04/11/23 126/72  09/15/22 132/80  08/28/22 120/76    Physical Exam Vitals and nursing note reviewed.  Constitutional:      General: She is not in acute distress.    Appearance: Normal appearance. She is well-developed.  HENT:     Head: Normocephalic and atraumatic.  Neck:     Vascular: No carotid bruit.  Cardiovascular:     Rate and Rhythm: Normal rate and regular  rhythm.     Heart sounds: No murmur heard. Pulmonary:     Effort: Pulmonary effort is normal. No respiratory distress.     Breath sounds: No wheezing or rhonchi.  Abdominal:     General: Abdomen is flat.     Palpations: Abdomen is soft. There is no mass.     Tenderness: There is no abdominal tenderness.  Musculoskeletal:     Cervical back: Normal range of motion.     Right lower leg: No edema.     Left lower leg: No edema.  Lymphadenopathy:     Cervical: No cervical adenopathy.  Skin:  General: Skin is warm and dry.     Findings: No rash.  Neurological:     General: No focal deficit present.     Mental Status: She is alert and oriented to person, place, and time.     Gait: Gait normal.  Psychiatric:        Mood and Affect: Mood normal.        Behavior: Behavior normal.     Wt Readings from Last 3 Encounters:  04/11/23 121 lb 12.8 oz (55.2 kg)  09/15/22 107 lb 9.6 oz (48.8 kg)  08/28/22 110 lb (49.9 kg)    BP 126/72   Pulse 89   Ht 5\' 1"  (1.549 m)   Wt 121 lb 12.8 oz (55.2 kg)   SpO2 97%   BMI 23.01 kg/m   Assessment and Plan:  Problem List Items Addressed This Visit     Mood disorder (HCC)    Mood is stable and much improved on Remeron Continue nightly dosing.      Hyperlipidemia (Chronic)    Tolerating statin medications.  No side effects noted. LDL is  Lab Results  Component Value Date   LDLCALC 137 (H) 01/04/2022        Relevant Orders   Lipid panel   Hepatic adenoma    Work up last year not definitive but patient declined repeat biopsy She is not interested in repeat MRI at this time She will continue to monitor symptoms and follow up as needed Continue Ensure daily for nutritional support      Relevant Orders   Comprehensive metabolic panel   Esophageal reflux    Reflux symptoms are minimal on current therapy - omeprazole. No red flag signs such as weight loss, n/v, melena She has gained some weight since last year      Relevant  Orders   CBC with Differential/Platelet   Chronic kidney disease, stage 3b (HCC) (Chronic)    Continue to monitor      Relevant Orders   Comprehensive metabolic panel   Atherosclerosis of aorta (HCC)    On appropriate statin therapy      Acquired hypothyroidism - Primary    supplemented      Relevant Orders   TSH + free T4    Return in about 6 months (around 10/11/2023) for thyroid.   Partially dictated using Dragon software, any errors are not intentional.  Reubin Milan, MD [facility] [place], [place]

## 2023-04-11 NOTE — Assessment & Plan Note (Signed)
On appropriate statin therapy 

## 2023-04-11 NOTE — Assessment & Plan Note (Signed)
supplemented

## 2023-04-11 NOTE — Assessment & Plan Note (Signed)
Mood is stable and much improved on Remeron Continue nightly dosing.

## 2023-04-11 NOTE — Assessment & Plan Note (Signed)
Continue to monitor

## 2023-04-11 NOTE — Assessment & Plan Note (Signed)
Work up last year not definitive but patient declined repeat biopsy She is not interested in repeat MRI at this time She will continue to monitor symptoms and follow up as needed Continue Ensure daily for nutritional support

## 2023-04-11 NOTE — Assessment & Plan Note (Signed)
Tolerating statin medications.  No side effects noted. LDL is  Lab Results  Component Value Date   LDLCALC 137 (H) 01/04/2022

## 2023-04-12 LAB — LIPID PANEL
Chol/HDL Ratio: 3.3 ratio (ref 0.0–4.4)
Cholesterol, Total: 224 mg/dL — ABNORMAL HIGH (ref 100–199)
HDL: 68 mg/dL (ref >39)
LDL Chol Calc (NIH): 138 mg/dL — ABNORMAL HIGH (ref 0–99)
Triglycerides: 104 mg/dL (ref 0–149)
VLDL Cholesterol Cal: 18 mg/dL (ref 5–40)

## 2023-04-12 LAB — COMPREHENSIVE METABOLIC PANEL
ALT: 32 IU/L (ref 0–32)
AST: 41 IU/L — ABNORMAL HIGH (ref 0–40)
Albumin/Globulin Ratio: 1.6 (ref 1.2–2.2)
Albumin: 4.6 g/dL (ref 3.7–4.7)
Alkaline Phosphatase: 100 IU/L (ref 44–121)
BUN/Creatinine Ratio: 16 (ref 12–28)
BUN: 22 mg/dL (ref 8–27)
Bilirubin Total: 0.5 mg/dL (ref 0.0–1.2)
CO2: 22 mmol/L (ref 20–29)
Calcium: 10.4 mg/dL — ABNORMAL HIGH (ref 8.7–10.3)
Chloride: 101 mmol/L (ref 96–106)
Creatinine, Ser: 1.37 mg/dL — ABNORMAL HIGH (ref 0.57–1.00)
Globulin, Total: 2.9 g/dL (ref 1.5–4.5)
Glucose: 89 mg/dL (ref 70–99)
Potassium: 4.5 mmol/L (ref 3.5–5.2)
Sodium: 143 mmol/L (ref 134–144)
Total Protein: 7.5 g/dL (ref 6.0–8.5)
eGFR: 37 mL/min/{1.73_m2} — ABNORMAL LOW (ref >59)

## 2023-04-12 LAB — CBC WITH DIFFERENTIAL/PLATELET
Basophils Absolute: 0.1 10*3/uL (ref 0.0–0.2)
Basos: 1 %
EOS (ABSOLUTE): 0.1 10*3/uL (ref 0.0–0.4)
Eos: 1 %
Hematocrit: 35.2 % (ref 34.0–46.6)
Hemoglobin: 11.5 g/dL (ref 11.1–15.9)
Immature Grans (Abs): 0 10*3/uL (ref 0.0–0.1)
Immature Granulocytes: 0 %
Lymphocytes Absolute: 2.9 10*3/uL (ref 0.7–3.1)
Lymphs: 42 %
MCH: 28.3 pg (ref 26.6–33.0)
MCHC: 32.7 g/dL (ref 31.5–35.7)
MCV: 87 fL (ref 79–97)
Monocytes Absolute: 0.4 10*3/uL (ref 0.1–0.9)
Monocytes: 5 %
Neutrophils Absolute: 3.4 10*3/uL (ref 1.4–7.0)
Neutrophils: 51 %
Platelets: 237 10*3/uL (ref 150–450)
RBC: 4.06 x10E6/uL (ref 3.77–5.28)
RDW: 14.1 % (ref 11.7–15.4)
WBC: 6.8 10*3/uL (ref 3.4–10.8)

## 2023-04-12 LAB — TSH+FREE T4
Free T4: 1.3 ng/dL (ref 0.82–1.77)
TSH: 2.5 u[IU]/mL (ref 0.450–4.500)

## 2023-06-14 ENCOUNTER — Other Ambulatory Visit: Payer: Self-pay | Admitting: Internal Medicine

## 2023-06-14 DIAGNOSIS — K219 Gastro-esophageal reflux disease without esophagitis: Secondary | ICD-10-CM

## 2023-07-03 ENCOUNTER — Other Ambulatory Visit: Payer: Self-pay | Admitting: Internal Medicine

## 2023-07-03 DIAGNOSIS — F39 Unspecified mood [affective] disorder: Secondary | ICD-10-CM

## 2023-07-03 DIAGNOSIS — E7800 Pure hypercholesterolemia, unspecified: Secondary | ICD-10-CM

## 2023-07-03 DIAGNOSIS — E039 Hypothyroidism, unspecified: Secondary | ICD-10-CM

## 2023-07-03 DIAGNOSIS — K219 Gastro-esophageal reflux disease without esophagitis: Secondary | ICD-10-CM

## 2023-07-03 NOTE — Telephone Encounter (Signed)
Medication Refill - Medication:  atorvastatin (LIPITOR) 10 MG tablet levothyroxine (SYNTHROID) 50 MCG tablet  mirtazapine (REMERON) 15 MG tablet  omeprazole (PRILOSEC) 20 MG capsule   Has the patient contacted their pharmacy? No. (I asked the pt to call pharmacy next time.  Preferred Pharmacy (with phone number or street name): [id number] - [place], [place] - [address] [place] [address] [address]  Has the patient been seen for an appointment in the last year OR does the patient have an upcoming appointment? Yes.    Agent: Please be advised that RX refills may take up to 3 business days. We ask that you follow-up with your pharmacy.

## 2023-07-04 MED ORDER — LEVOTHYROXINE SODIUM 50 MCG PO TABS: 50.0000 ug | ORAL_TABLET | Freq: Every day | ORAL | 0 refills | Status: DC

## 2023-07-04 MED ORDER — ATORVASTATIN CALCIUM 10 MG PO TABS: 10.0000 mg | ORAL_TABLET | Freq: Every day | ORAL | 0 refills | Status: DC

## 2023-07-04 MED ORDER — MIRTAZAPINE 15 MG PO TABS: 15.0000 mg | ORAL_TABLET | Freq: Every day | ORAL | 0 refills | Status: DC

## 2023-07-04 NOTE — Telephone Encounter (Signed)
Requested Prescriptions  Pending Prescriptions Disp Refills   atorvastatin (LIPITOR) 10 MG tablet 90 tablet 0    Sig: Take 1 tablet (10 mg total) by mouth daily. TAKE 1 TABLET(10 MG) BY MOUTH AT BEDTIME     Cardiovascular:  Antilipid - Statins Failed - 07/03/2023 11:11 AM      Failed - Lipid Panel in normal range within the last 12 months    Cholesterol, Total  Date Value Ref Range Status  04/11/2023 224 (H) 100 - 199 mg/dL Final   Cholesterol Piccolo, Waived  Date Value Ref Range Status  06/04/2019 200 (H) <200 mg/dL Final    Comment:                            Desirable                <200                         Borderline High      200- 239                         High                     >239    LDL Chol Calc (NIH)  Date Value Ref Range Status  04/11/2023 138 (H) 0 - 99 mg/dL Final   HDL  Date Value Ref Range Status  04/11/2023 68 >39 mg/dL Final   Triglycerides  Date Value Ref Range Status  04/11/2023 104 0 - 149 mg/dL Final   Triglycerides Piccolo,Waived  Date Value Ref Range Status  06/04/2019 106 <150 mg/dL Final    Comment:                            Normal                   <150                         Borderline High     150 - 199                         High                200 - 499                         Very High                >499          Passed - Patient is not pregnant      Passed - Valid encounter within last 12 months    Recent Outpatient Visits           2 months ago Acquired hypothyroidism   [place] [facility] at [facility], Nyoka Cowden, MD   10 months ago Cervical paraspinal muscle spasm   [place] [facility] at [facility], Ocie Bob, MD   1 year ago Essential hypertension   [place] [facility] at [facility], Nyoka Cowden, MD   1 year ago Essential hypertension   [place] [facility] Care &  Sports Medicine at [facility], Nyoka Cowden, MD   1 year ago Essential hypertension   [place] [facility] & Sports Medicine at [facility], Nyoka Cowden, MD               levothyroxine (SYNTHROID) 50 MCG tablet 90 tablet 0    Sig: Take 1 tablet (50 mcg total) by mouth daily.     Endocrinology:  Hypothyroid Agents Passed - 07/03/2023 11:11 AM      Passed - TSH in normal range and within 360 days    TSH  Date Value Ref Range Status  04/11/2023 2.500 0.450 - 4.500 uIU/mL Final         Passed - Valid encounter within last 12 months    Recent Outpatient Visits           2 months ago Acquired hypothyroidism   [place] [facility] Medicine at [facility], Nyoka Cowden, MD   10 months ago Cervical paraspinal muscle spasm   [place] [facility] at [facility], Ocie Bob, MD   1 year ago Essential hypertension   [place] [facility] Medicine at [facility], Nyoka Cowden, MD   1 year ago Essential hypertension   [place] [facility] & [facility] at [facility], Nyoka Cowden, MD   1 year ago Essential hypertension   [place] [facility] Care & Sports Medicine at [facility], Nyoka Cowden, MD               mirtazapine (REMERON) 15 MG tablet 90 tablet 0    Sig: Take 1 tablet (15 mg total) by mouth daily.     Psychiatry: Antidepressants - mirtazapine Passed - 07/03/2023 11:11 AM      Passed - Valid encounter within last 6 months    Recent Outpatient Visits           2 months ago Acquired hypothyroidism   [place] [facility] at [facility], Nyoka Cowden, MD   10 months ago Cervical paraspinal muscle spasm   [place] [facility] at [facility], Ocie Bob, MD   1 year ago Essential hypertension   [place] [facility] Sports Medicine at [facility], Nyoka Cowden, MD   1 year ago Essential hypertension   [facility] at [facility], Nyoka Cowden, MD   1 year ago Essential hypertension   [place] [facility] & [facility] Medicine at [facility], Nyoka Cowden, MD               omeprazole (PRILOSEC) 20 MG capsule 90 capsule 1     Gastroenterology: Proton Pump Inhibitors Passed - 07/03/2023 11:11 AM      Passed - Valid encounter within last 12 months    Recent Outpatient Visits           2 months ago Acquired hypothyroidism   [place] [facility] at [facility], Nyoka Cowden, MD   10 months ago Cervical paraspinal muscle spasm   [place] [facility] & [facility] Medicine at [facility], Ocie Bob, MD   1 year ago Essential hypertension   [place] [facility] Sports Medicine at [facility], Nyoka Cowden, MD   1 year ago Essential hypertension    Primary Care &  Sports Medicine at [facility], Nyoka Cowden, MD   1 year ago Essential hypertension   [facility] at [facility], Nyoka Cowden, MD

## 2023-07-11 ENCOUNTER — Other Ambulatory Visit: Payer: Self-pay | Admitting: Internal Medicine

## 2023-07-11 DIAGNOSIS — K219 Gastro-esophageal reflux disease without esophagitis: Secondary | ICD-10-CM

## 2023-07-11 MED ORDER — OMEPRAZOLE 20 MG PO CPDR: 20.0000 mg | DELAYED_RELEASE_CAPSULE | Freq: Every day | ORAL | 1 refills | Status: DC

## 2023-09-12 ENCOUNTER — Other Ambulatory Visit: Payer: Self-pay | Admitting: Internal Medicine

## 2023-09-12 DIAGNOSIS — E039 Hypothyroidism, unspecified: Secondary | ICD-10-CM

## 2023-09-12 DIAGNOSIS — F39 Unspecified mood [affective] disorder: Secondary | ICD-10-CM

## 2023-09-12 DIAGNOSIS — E7800 Pure hypercholesterolemia, unspecified: Secondary | ICD-10-CM

## 2023-09-13 NOTE — Telephone Encounter (Signed)
Requested Prescriptions  Pending Prescriptions Disp Refills   mirtazapine (REMERON) 15 MG tablet [Pharmacy Med Name: MIRTAZAPINE 15MG  TABLETS] 90 tablet 0    Sig: TAKE 1 TABLET(15 MG) BY MOUTH DAILY     Psychiatry: Antidepressants - mirtazapine Passed - 09/12/2023  6:22 AM      Passed - Valid encounter within last 6 months    Recent Outpatient Visits           5 months ago Acquired hypothyroidism   [place] [facility] Medicine at [facility], Nyoka Cowden, MD   1 year ago Cervical paraspinal muscle spasm   [place] [facility] & Sports Medicine at [facility], Ocie Bob, MD   1 year ago Essential hypertension   [place] [facility] Medicine at [facility], Nyoka Cowden, MD   1 year ago Essential hypertension   [place] [facility] Care & Sports Medicine at [facility], Nyoka Cowden, MD   1 year ago Essential hypertension   [place] [facility] at [facility], Nyoka Cowden, MD               levothyroxine (SYNTHROID) 50 MCG tablet [Pharmacy Med Name: LEVOTHYROXINE 0.05MG  ( ) TAB] 90 tablet 2    Sig: TAKE 1 TABLET(50 MCG) BY MOUTH DAILY     Endocrinology:  Hypothyroid Agents Passed - 09/12/2023  6:22 AM      Passed - TSH in normal range and within 360 days    TSH  Date Value Ref Range Status  04/11/2023 2.500 0.450 - 4.500 uIU/mL Final         Passed - Valid encounter within last 12 months    Recent Outpatient Visits           5 months ago Acquired hypothyroidism   [place] [facility] Medicine at [facility], Nyoka Cowden, MD   1 year ago Cervical paraspinal muscle spasm   [place] [facility] Medicine at [facility], Ocie Bob, MD   1 year ago Essential hypertension   [place] [facility] Sports Medicine at [facility], Nyoka Cowden, MD   1 year ago Essential hypertension   [place] [facility]  Medicine at [facility], Nyoka Cowden, MD   1 year ago Essential hypertension   [place] [facility] Care & Sports Medicine at [facility], Nyoka Cowden, MD               atorvastatin (LIPITOR) 10 MG tablet [Pharmacy Med Name: ATORVASTATIN 10MG  TABLETS] 90 tablet 2    Sig: TAKE 1 TABLET(10 MG) BY MOUTH DAILY AT BEDTIME     Cardiovascular:  Antilipid - Statins Failed - 09/12/2023  6:22 AM      Failed - Lipid Panel in normal range within the last 12 months    Cholesterol, Total  Date Value Ref Range Status  04/11/2023 224 (H) 100 - 199 mg/dL Final   Cholesterol Piccolo, Waived  Date Value Ref Range Status  06/04/2019 200 (H) <200 mg/dL Final    Comment:                            Desirable                <200  Borderline High      200- 239                         High                     >239    LDL Chol Calc (NIH)  Date Value Ref Range Status  04/11/2023 138 (H) 0 - 99 mg/dL Final   HDL  Date Value Ref Range Status  04/11/2023 68 >39 mg/dL Final   Triglycerides  Date Value Ref Range Status  04/11/2023 104 0 - 149 mg/dL Final   Triglycerides Piccolo,Waived  Date Value Ref Range Status  06/04/2019 106 <150 mg/dL Final    Comment:                            Normal                   <150                         Borderline High     150 - 199                         High                200 - 499                         Very High                >499          Passed - Patient is not pregnant      Passed - Valid encounter within last 12 months    Recent Outpatient Visits           5 months ago Acquired hypothyroidism   [place] [facility] at [facility], Nyoka Cowden, MD   1 year ago Cervical paraspinal muscle spasm   [place] [facility] at [facility], Ocie Bob, MD   1 year ago Essential hypertension   [place] [facility] Care & Sports Medicine at [facility], Nyoka Cowden, MD   1 year ago Essential hypertension   [place] [facility] Sports Medicine at [facility], Nyoka Cowden, MD   1 year ago Essential hypertension   [facility] at [facility], Nyoka Cowden, MD

## 2023-10-15 ENCOUNTER — Telehealth: Payer: Self-pay | Admitting: Internal Medicine

## 2023-10-15 NOTE — Telephone Encounter (Signed)
Patient daughter Ms Julien Girt informed.  - Arla Boutwell

## 2023-10-15 NOTE — Telephone Encounter (Signed)
patient daughter came in asking what the patient can take with having symptoms of course and congestion that is over the counter [phone]

## 2023-11-05 ENCOUNTER — Other Ambulatory Visit: Payer: Self-pay | Admitting: Internal Medicine

## 2023-11-05 ENCOUNTER — Encounter: Payer: Self-pay | Admitting: Internal Medicine

## 2023-11-05 DIAGNOSIS — E039 Hypothyroidism, unspecified: Secondary | ICD-10-CM

## 2023-11-05 NOTE — Telephone Encounter (Signed)
Medication Refill -  Most Recent Primary Care Visit:  Provider: Reubin Milan  Department: [facility] [place]  Visit Type: OFFICE VISIT  Date: 04/11/2023  Medication: levothyroxine (SYNTHROID) 50 MCG tablet   Has the patient contacted their pharmacy? Yes   Is this the correct pharmacy for this prescription? Yes  This is the patient's preferred pharmacy:  [id number], [place] - [address] AT [facility] [address] [place] [address] [place] [address] Phone: [phone] Fax: [phone]   Has the prescription been filled recently? Yes  Is the patient out of the medication? Yes  Has the patient been seen for an appointment in the last year OR does the patient have an upcoming appointment? Yes  Can we respond through MyChart? Yes  Agent: Please be advised that Rx refills may take up to 3 business days. We ask that you follow-up with your pharmacy.

## 2023-11-09 MED ORDER — LEVOTHYROXINE SODIUM 50 MCG PO TABS: 50.0000 ug | ORAL_TABLET | Freq: Every day | ORAL | 2 refills | Status: AC

## 2023-11-09 NOTE — Telephone Encounter (Signed)
 Requested Prescriptions  Pending Prescriptions Disp Refills   levothyroxine  (SYNTHROID ) 50 MCG tablet 90 tablet 2    Sig: Take 1 tablet (50 mcg total) by mouth daily before breakfast.     Endocrinology:  Hypothyroid Agents Passed - 11/09/2023  7:34 AM      Passed - TSH in normal range and within 360 days    TSH  Date Value Ref Range Status  04/11/2023 2.500 0.450 - 4.500 uIU/mL Final         Passed - Valid encounter within last 12 months    Recent Outpatient Visits           7 months ago Acquired hypothyroidism   [place] [facility] at [facility], Leita DEL, MD   1 year ago Cervical paraspinal muscle spasm   [place] [facility] at [facility], Selinda PARAS, MD   1 year ago Essential hypertension   [place] [facility] at [facility], Leita DEL, MD   1 year ago Essential hypertension   [place] [facility] Medicine at [facility], Leita DEL, MD   1 year ago Essential hypertension   [facility] at [facility], Leita DEL, MD

## 2024-01-02 ENCOUNTER — Ambulatory Visit (INDEPENDENT_AMBULATORY_CARE_PROVIDER_SITE_OTHER): Payer: Medicare HMO

## 2024-01-02 DIAGNOSIS — Z0000 Encounter for general adult medical examination without abnormal findings: Secondary | ICD-10-CM | POA: Diagnosis not present

## 2024-01-02 NOTE — Patient Instructions (Addendum)
 Michelle Novak , Thank you for taking time to come for your Medicare Wellness Visit. I appreciate your ongoing commitment to your health goals. Please review the following plan we discussed and let me know if I can assist you in the future.   Referrals/Orders/Follow-Ups/Clinician Recommendations: NONE  This is a list of the screening recommended for you and due dates:  Health Maintenance  Topic Date Due   DTaP/Tdap/Td vaccine (2 - Td or Tdap) 07/31/2019   COVID-19 Vaccine (6 - 2024-25 season) 07/08/2023   Flu Shot  02/04/2024*   Medicare Annual Wellness Visit  01/01/2025   Pneumonia Vaccine  Completed   DEXA scan (bone density measurement)  Completed   Zoster (Shingles) Vaccine  Completed   HPV Vaccine  Aged Out  *Topic was postponed. The date shown is not the original due date.    Advanced directives: (ACP Link)Information on Advanced Care Planning can be found at [facility] Advance Health Care Directives Advance Health Care Directives ([email or web address]   Next Medicare Annual Wellness Visit scheduled for next year: Yes   01/07/25 @ 10:50 AM BY PHONE

## 2024-01-02 NOTE — Progress Notes (Signed)
 Subjective:   Michelle Novak is a 89 y.o. who presents for a Medicare Wellness preventive visit.  Visit Complete: Virtual I connected with  Eugenie Birks on 01/02/24 by a audio enabled telemedicine application and verified that I am speaking with the correct person using two identifiers.  Patient Location: Home  Provider Location: Office/Clinic  I discussed the limitations of evaluation and management by telemedicine. The patient expressed understanding and agreed to proceed.  Vital Signs: Because this visit was a virtual/telehealth visit, some criteria may be missing or patient reported. Any vitals not documented were not able to be obtained and vitals that have been documented are patient reported.  VideoDeclined- This patient declined Librarian, academic. Therefore the visit was completed with audio only.  AWV Questionnaire: No: Patient Medicare AWV questionnaire was not completed prior to this visit.  Cardiac Risk Factors include: advanced age (>16men, >93 women);dyslipidemia     Objective:    There were no vitals filed for this visit. There is no height or weight on file to calculate BMI.     01/02/2024    1:04 PM 09/15/2022    3:21 PM 07/20/2022    1:34 PM 07/05/2022    3:35 PM 06/29/2022    9:27 AM 10/24/2018   11:34 AM 10/03/2017   11:08 AM  Advanced Directives  Does Patient Have a Medical Advance Directive? No Yes Yes Yes Yes Yes Yes  Type of Advance Directive  Living will;Healthcare Power of [employer] Power of [place];Living will  Living will;Healthcare Power of [employer] Power of [employer] Power of [place];Living will  Does patient want to make changes to medical advance directive?  Yes (ED - Information included in AVS)  No - Patient declined  No - Patient declined   Copy of Healthcare Power of Attorney in Chart?      Yes - validated most recent copy scanned in chart (See row information) No -  copy requested  Would patient like information on creating a medical advance directive? No - Patient declined Yes (ED - Information included in AVS)         Current Medications (verified) Outpatient Encounter Medications as of 01/02/2024  Medication Sig   atorvastatin (LIPITOR) 10 MG tablet TAKE 1 TABLET(10 MG) BY MOUTH DAILY AT BEDTIME   levothyroxine (SYNTHROID) 50 MCG tablet Take 1 tablet (50 mcg total) by mouth daily before breakfast.   mirtazapine (REMERON) 15 MG tablet TAKE 1 TABLET(15 MG) BY MOUTH DAILY   omeprazole (PRILOSEC) 20 MG capsule Take 1 capsule (20 mg total) by mouth daily.   ferrous sulfate 325 (65 FE) MG EC tablet Take 1 tablet (325 mg total) by mouth daily with breakfast.   No facility-administered encounter medications on file as of 01/02/2024.    Allergies (verified) Patient has no known allergies.   History: Past Medical History:  Diagnosis Date   Arthritis    Chronic kidney disease    Esophageal reflux    Essential hypertension 09/17/2020   Heart murmur    History of abnormal cervical Pap smear    Hyperlipidemia    Hypertension    Hypothyroidism    Osteoporosis    Past Surgical History:  Procedure Laterality Date   ABDOMINAL HYSTERECTOMY     JOINT REPLACEMENT     Family History  Problem Relation Age of Onset   Heart disease Mother    Heart attack Mother    Cancer Father  bone   Heart disease Sister    Social History   Socioeconomic History   Marital status: Widowed    Spouse name: Not on file   Number of children: Not on file   Years of education: 12   Highest education level: 12th grade  Occupational History   Not on file  Tobacco Use   Smoking status: Never   Smokeless tobacco: Never  Vaping Use   Vaping status: Never Used  Substance and Sexual Activity   Alcohol use: No   Drug use: No   Sexual activity: Not on file  Other Topics Concern   Not on file  Social History Narrative   Not on file   Social Drivers of Health    Financial Resource Strain: Low Risk  (01/02/2024)   Overall Financial Resource Strain (CARDIA)    Difficulty of Paying Living Expenses: Not hard at all  Food Insecurity: No Food Insecurity (01/02/2024)   Hunger Vital Sign    Worried About Running Out of Food in the Last Year: Never true    Ran Out of Food in the Last Year: Never true  Transportation Needs: No Transportation Needs (01/02/2024)   PRAPARE - Administrator, Civil Service (Medical): No    Lack of Transportation (Non-Medical): No  Physical Activity: Insufficiently Active (01/02/2024)   Exercise Vital Sign    Days of Exercise per Week: 2 days    Minutes of Exercise per Session: 20 min  Stress: No Stress Concern Present (01/02/2024)   [employer] of Occupational Health - Occupational Stress Questionnaire    Feeling of Stress : Not at all  Social Connections: Moderately Integrated (01/02/2024)   Social Connection and Isolation Panel [NHANES]    Frequency of Communication with Friends and Family: More than three times a week    Frequency of Social Gatherings with Friends and Family: Three times a week    Attends Religious Services: More than 4 times per year    Active Member of Clubs or Organizations: Yes    Attends Banker Meetings: More than 4 times per year    Marital Status: Widowed    Tobacco Counseling Counseling given: Not Answered    Clinical Intake:  Pre-visit preparation completed: Yes  Pain : No/denies pain     BMI - recorded: 22.9 Nutritional Status: BMI of 19-24  Normal Nutritional Risks: None Diabetes: No  How often do you need to have someone help you when you read instructions, pamphlets, or other written materials from your doctor or pharmacy?: 1 - Never  Interpreter Needed?: No  Information entered by :: Kennedy Bucker, LPN   Activities of Daily Living     01/02/2024    1:05 PM  In your present state of health, do you have any difficulty performing the  following activities:  Hearing? 0  Vision? 0  Difficulty concentrating or making decisions? 0  Walking or climbing stairs? 1  Comment BALANCE MAKES IT HARD  Dressing or bathing? 0  Doing errands, shopping? 0  Preparing Food and eating ? N  Using the Toilet? N  In the past six months, have you accidently leaked urine? N  Do you have problems with loss of bowel control? N  Managing your Medications? N  Managing your Finances? N  Housekeeping or managing your Housekeeping? N    Patient Care Team: Reubin Milan, MD as PCP - General (Internal Medicine) Benita Gutter, RN as Oncology Nurse Navigator Pa, [facility]  Od  Indicate any recent Medical Services you may have received from other than [facility] providers in the past year (date may be approximate).     Assessment:   This is a routine wellness examination for [facility].  Hearing/Vision screen Hearing Screening - Comments:: NO AIDS Vision Screening - Comments:: WEARS ALL THE TIME- [facility] [place]    Goals Addressed             This Visit's Progress    DIET - EAT MORE FRUITS AND VEGETABLES         Depression Screen     01/02/2024    1:00 PM 04/11/2023   10:38 AM 07/05/2022    3:34 PM 06/19/2022    3:09 PM 03/15/2022   11:05 AM 01/04/2022    1:54 PM 11/27/2019    4:08 PM  PHQ 2/9 Scores  PHQ - 2 Score 0 0 2 2 1  0 0  PHQ- 9 Score 0 1 2 6 3 3 1     Fall Risk     01/02/2024    1:05 PM 04/11/2023   10:38 AM 07/05/2022    3:36 PM 06/19/2022    3:10 PM 03/15/2022   11:05 AM  Fall Risk   Falls in the past year? 0 0 0 0 0  Number falls in past yr: 0 0 0 0 0  Injury with Fall? 0 0 0 0 0  Risk for fall due to : No Fall Risks No Fall Risks No Fall Risks No Fall Risks No Fall Risks  Follow up Falls prevention discussed;Falls evaluation completed Falls evaluation completed Falls evaluation completed Falls evaluation completed Falls evaluation completed    MEDICARE RISK AT HOME:  Medicare Risk at  Home Any stairs in or around the home?: Yes If so, are there any without handrails?: No Home free of loose throw rugs in walkways, pet beds, electrical cords, etc?: Yes Adequate lighting in your home to reduce risk of falls?: Yes Life alert?: No Use of a cane, walker or w/c?: No Grab bars in the bathroom?: No Shower chair or bench in shower?: Yes Elevated toilet seat or a handicapped toilet?: No  TIMED UP AND GO:  Was the test performed?  No  Cognitive Function: 6CIT completed        01/02/2024    1:07 PM 07/05/2022    3:36 PM 10/24/2018   11:34 AM 10/03/2017   11:11 AM  6CIT Screen  What Year? 0 points 0 points 0 points 0 points  What month? 0 points 0 points 0 points 0 points  What time? 0 points 0 points 0 points 0 points  Count back from 20 0 points 0 points 0 points 0 points  Months in reverse 4 points 0 points 0 points 0 points  Repeat phrase 6 points 4 points 6 points 2 points  Total Score 10 points 4 points 6 points 2 points    Immunizations Immunization History  Administered Date(s) Administered   Influenza, High Dose Seasonal PF 10/02/2016   Influenza,inj,quad, With Preservative 09/03/2018   Influenza-Unspecified 08/21/2013, 08/17/2015, 08/28/2017, 08/26/2018, 07/14/2021   Moderna Covid-19 Vaccine Bivalent Booster 15yrs & up 09/12/2021   Moderna Sars-Covid-2 Vaccination 12/02/2019, 12/31/2019, 09/09/2020, 04/01/2021   Pneumococcal Conjugate-13 09/23/2014, 09/21/2020   Pneumococcal Polysaccharide-23 06/28/2005, 02/08/2021   Tdap 07/30/2009   Zoster Recombinant(Shingrix) 12/20/2018, 04/08/2019, 08/20/2019   Zoster, Live 06/14/2006    Screening Tests Health Maintenance  Topic Date Due   DTaP/Tdap/Td (2 - Td or Tdap) 07/31/2019  COVID-19 Vaccine (6 - 2024-25 season) 07/08/2023   INFLUENZA VACCINE  02/04/2024 (Originally 06/07/2023)   Medicare Annual Wellness (AWV)  01/01/2025   Pneumonia Vaccine 47+ Years old  Completed   DEXA SCAN  Completed   Zoster  Vaccines- Shingrix  Completed   HPV VACCINES  Aged Out    Health Maintenance  Health Maintenance Due  Topic Date Due   DTaP/Tdap/Td (2 - Td or Tdap) 07/31/2019   COVID-19 Vaccine (6 - 2024-25 season) 07/08/2023   Health Maintenance Items Addressed: NEEDS FLU SHOT, TD  Additional Screening:  Vision Screening: Recommended annual ophthalmology exams for early detection of glaucoma and other disorders of the eye.  Dental Screening: Recommended annual dental exams for proper oral hygiene  Community Resource Referral / Chronic Care Management: CRR required this visit?  No   CCM required this visit?  No     Plan:     I have personally reviewed and noted the following in the patient's chart:   Medical and social history Use of alcohol, tobacco or illicit drugs  Current medications and supplements including opioid prescriptions. Patient is not currently taking opioid prescriptions. Functional ability and status Nutritional status Physical activity Advanced directives List of other physicians Hospitalizations, surgeries, and ER visits in previous 12 months Vitals Screenings to include cognitive, depression, and falls Referrals and appointments  In addition, I have reviewed and discussed with patient certain preventive protocols, quality metrics, and best practice recommendations. A written personalized care plan for preventive services as well as general preventive health recommendations were provided to patient.     Hal Hope, LPN   [id number]/1191   After Visit Summary: (MyChart) Due to this being a telephonic visit, the after visit summary with patients personalized plan was offered to patient via MyChart   Notes: Nothing significant to report at this time.

## 2024-01-11 ENCOUNTER — Other Ambulatory Visit: Payer: Self-pay | Admitting: Internal Medicine

## 2024-01-11 DIAGNOSIS — K219 Gastro-esophageal reflux disease without esophagitis: Secondary | ICD-10-CM

## 2024-01-11 NOTE — Telephone Encounter (Signed)
 Requested Prescriptions  Pending Prescriptions Disp Refills   omeprazole (PRILOSEC) 20 MG capsule [Pharmacy Med Name: OMEPRAZOLE 20MG  CAPSULES] 90 capsule 0    Sig: TAKE 1 CAPSULE(20 MG) BY MOUTH DAILY     Gastroenterology: Proton Pump Inhibitors Passed - 01/11/2024  1:29 PM      Passed - Valid encounter within last 12 months    Recent Outpatient Visits           9 months ago Acquired hypothyroidism    [place] [facility] at [facility], Nyoka Cowden, MD   1 year ago Cervical paraspinal muscle spasm   [place] [facility] at [facility], Ocie Bob, MD   1 year ago Essential hypertension   [place] [facility] at [facility], Nyoka Cowden, MD   1 year ago Essential hypertension   [place] [facility] at [facility], Nyoka Cowden, MD   2 years ago Essential hypertension   [facility] at [facility], Nyoka Cowden, MD

## 2024-01-17 ENCOUNTER — Other Ambulatory Visit: Payer: Self-pay | Admitting: Internal Medicine

## 2024-01-17 DIAGNOSIS — E7800 Pure hypercholesterolemia, unspecified: Secondary | ICD-10-CM

## 2024-01-17 DIAGNOSIS — K219 Gastro-esophageal reflux disease without esophagitis: Secondary | ICD-10-CM

## 2024-01-17 MED ORDER — ATORVASTATIN CALCIUM 10 MG PO TABS: 10.0000 mg | ORAL_TABLET | Freq: Every day | ORAL | Status: AC

## 2024-01-17 NOTE — Telephone Encounter (Signed)
 Refilled 01/11/24 # 90. Requested Prescriptions  Signed Prescriptions Disp Refills   atorvastatin (LIPITOR) 10 MG tablet 90 tablet 1`    Sig: Take 1 tablet (10 mg total) by mouth daily.     Cardiovascular:  Antilipid - Statins Failed - 01/17/2024  3:48 PM      Failed - Lipid Panel in normal range within the last 12 months    Cholesterol, Total  Date Value Ref Range Status  04/11/2023 224 (H) 100 - 199 mg/dL Final   Cholesterol Piccolo, Waived  Date Value Ref Range Status  06/04/2019 200 (H) <200 mg/dL Final    Comment:                            Desirable                <200                         Borderline High      200- 239                         High                     >239    LDL Chol Calc (NIH)  Date Value Ref Range Status  04/11/2023 138 (H) 0 - 99 mg/dL Final   HDL  Date Value Ref Range Status  04/11/2023 68 >39 mg/dL Final   Triglycerides  Date Value Ref Range Status  04/11/2023 104 0 - 149 mg/dL Final   Triglycerides Piccolo,Waived  Date Value Ref Range Status  06/04/2019 106 <150 mg/dL Final    Comment:                            Normal                   <150                         Borderline High     150 - 199                         High                200 - 499                         Very High                >499          Passed - Patient is not pregnant      Passed - Valid encounter within last 12 months    Recent Outpatient Visits           9 months ago Acquired hypothyroidism   [place] [facility] at [facility], Nyoka Cowden, MD   1 year ago Cervical paraspinal muscle spasm   [place] [facility] at [facility], Ocie Bob, MD   1 year ago Essential hypertension   [place] [facility] at [facility], Nyoka Cowden, MD   1 year ago Essential hypertension   [place] [facility] at  [facility], [facility]   2  years ago Essential hypertension   [place] [facility] at [facility], Nyoka Cowden, MD              Refused Prescriptions Disp Refills   omeprazole (PRILOSEC) 20 MG capsule 90 capsule 0     Gastroenterology: Proton Pump Inhibitors Passed - 01/17/2024  3:48 PM      Passed - Valid encounter within last 12 months    Recent Outpatient Visits           9 months ago Acquired hypothyroidism   [place] [facility] at [facility], Nyoka Cowden, MD   1 year ago Cervical paraspinal muscle spasm   [place] [facility] at [facility], Ocie Bob, MD   1 year ago Essential hypertension   [place] [facility] at [facility], Nyoka Cowden, MD   1 year ago Essential hypertension    Primary Care & Sports Medicine at [facility], Nyoka Cowden, MD   2 years ago Essential hypertension   [facility] at [facility], Nyoka Cowden, MD

## 2024-01-17 NOTE — Telephone Encounter (Signed)
 Requested Prescriptions  Pending Prescriptions Disp Refills   omeprazole (PRILOSEC) 20 MG capsule 90 capsule 0     Gastroenterology: Proton Pump Inhibitors Passed - 01/17/2024  3:46 PM      Passed - Valid encounter within last 12 months    Recent Outpatient Visits           9 months ago Acquired hypothyroidism   [place] [facility] at [facility], Nyoka Cowden, MD   1 year ago Cervical paraspinal muscle spasm   [place] [facility] at [facility], Ocie Bob, MD   1 year ago Essential hypertension   [place] [facility] at [facility], Nyoka Cowden, MD   1 year ago Essential hypertension   [place] [facility] at [facility], Nyoka Cowden, MD   2 years ago Essential hypertension   [place] [facility] at [facility], Nyoka Cowden, MD               atorvastatin (LIPITOR) 10 MG tablet 90 tablet 1`    Sig: Take 1 tablet (10 mg total) by mouth daily.     Cardiovascular:  Antilipid - Statins Failed - 01/17/2024  3:46 PM      Failed - Lipid Panel in normal range within the last 12 months    Cholesterol, Total  Date Value Ref Range Status  04/11/2023 224 (H) 100 - 199 mg/dL Final   Cholesterol Piccolo, Waived  Date Value Ref Range Status  06/04/2019 200 (H) <200 mg/dL Final    Comment:                            Desirable                <200                         Borderline High      200- 239                         High                     >239    LDL Chol Calc (NIH)  Date Value Ref Range Status  04/11/2023 138 (H) 0 - 99 mg/dL Final   HDL  Date Value Ref Range Status  04/11/2023 68 >39 mg/dL Final   Triglycerides  Date Value Ref Range Status  04/11/2023 104 0 - 149 mg/dL Final   Triglycerides Piccolo,Waived  Date Value Ref Range Status  06/04/2019 106 <150 mg/dL Final    Comment:                             Normal                   <150                         Borderline High     150 - 199                         High  200 - 499                         Very High                >499          Passed - Patient is not pregnant      Passed - Valid encounter within last 12 months    Recent Outpatient Visits           9 months ago Acquired hypothyroidism   [place] [facility] at [facility], Nyoka Cowden, MD   1 year ago Cervical paraspinal muscle spasm   [place] [facility] at [facility], Ocie Bob, MD   1 year ago Essential hypertension   [place] [facility] at [facility], Nyoka Cowden, MD   1 year ago Essential hypertension   [place] [facility] at [facility], Nyoka Cowden, MD   2 years ago Essential hypertension   [facility] at [facility], Nyoka Cowden, MD

## 2024-01-17 NOTE — Telephone Encounter (Signed)
 Copied from CRM [id number]. Topic: Clinical - Medication Refill >> Jan 17, 2024  9:27 AM Nada Libman H wrote: Most Recent Primary Care Visit:  Provider: Hal Hope  Department: [facility] [place]  Visit Type: MEDICARE AWV, SEQUENTIAL  Date: 01/02/2024  Medication: atorvastatin (LIPITOR) 10 MG tablet [[id number]] omeprazole (PRILOSEC) 20 MG capsule [[id number]]   Has the patient contacted their pharmacy? No (Agent: If no, request that the patient contact the pharmacy for the refill. If patient does not wish to contact the pharmacy document the reason why and proceed with request.) (Agent: If yes, when and what did the pharmacy advise?)  Is this the correct pharmacy for this prescription? Yes If no, delete pharmacy and type the correct one.  This is the patient's preferred pharmacy:  [id number] [place] - [address] AT [facility] OF [address] [place] [address] [place] [address] [place] [address] Phone: [phone] Fax: [phone]   Has the prescription been filled recently? Yes  Is the patient out of the medication? Yes  Has the patient been seen for an appointment in the last year OR does the patient have an upcoming appointment? Yes  Can we respond through MyChart? No  Agent: Please be advised that Rx refills may take up to 3 business days. We ask that you follow-up with your pharmacy.

## 2024-09-06 ENCOUNTER — Other Ambulatory Visit: Payer: Self-pay | Admitting: Internal Medicine

## 2024-09-06 DIAGNOSIS — K219 Gastro-esophageal reflux disease without esophagitis: Secondary | ICD-10-CM

## 2024-09-08 NOTE — Telephone Encounter (Signed)
 Requested Prescriptions  Pending Prescriptions Disp Refills   omeprazole  (PRILOSEC) 20 MG capsule [Pharmacy Med Name: OMEPRAZOLE  20MG  CAPSULES] 90 capsule 0    Sig: TAKE 1 CAPSULE(20 MG) BY MOUTH DAILY     Gastroenterology: Proton Pump Inhibitors Passed - 09/08/2024  2:20 PM      Passed - Valid encounter within last 12 months    Recent Outpatient Visits   None

## 2024-09-22 ENCOUNTER — Telehealth: Payer: Self-pay | Admitting: Pharmacist

## 2024-09-22 NOTE — Progress Notes (Signed)
 Pharmacy Quality Measure Review  This patient is appearing on a report for being at risk of failing the adherence measure for cholesterol (statin) medications this calendar year.   Medication: atorvastatin  10 mg daily Last fill date: 09/05/24 for 90 day supply  Insurance report was not up to date. No action needed at this time.   Catie [facility], PharmD, [facility] Clinical Pharmacist [phone]

## 2024-12-09 ENCOUNTER — Telehealth: Payer: Self-pay

## 2024-12-09 NOTE — Telephone Encounter (Signed)
 Copied from CRM [id number]. Topic: Clinical - Medication Question >> Dec 09, 2024  9:15 AM Rosaria BRAVO wrote: Reason for CRM: Pt's daughter called requesting to speak to the clinic regarding her medications. Says the current refill process stresses the patient out.   Best contact: [phone]

## 2024-12-09 NOTE — Telephone Encounter (Signed)
 Pt needs an appt last seen in 2024.  KP

## 2024-12-12 ENCOUNTER — Telehealth: Payer: Self-pay

## 2024-12-12 ENCOUNTER — Other Ambulatory Visit: Payer: Self-pay | Admitting: Internal Medicine

## 2024-12-12 DIAGNOSIS — E7800 Pure hypercholesterolemia, unspecified: Secondary | ICD-10-CM

## 2024-12-12 DIAGNOSIS — E039 Hypothyroidism, unspecified: Secondary | ICD-10-CM

## 2024-12-12 NOTE — Telephone Encounter (Signed)
 Copied from CRM [id number]. Topic: Appointments - Transfer of Care >> Dec 12, 2024  8:48 AM [name] wrote: Pt is requesting to transfer FROM: Justus Doffing MD  Pt is requesting to transfer TO: Kotturi, Vinay K, MD Reason for requested transfer: provider left  It is the responsibility of the team the patient would like to transfer to (Dr.Kotturi, Vinay K, MD ) to reach out to the patient if for any reason this transfer is not acceptable.

## 2024-12-12 NOTE — Telephone Encounter (Signed)
 Copied from CRM [id number]. Topic: Clinical - Medication Question >> Dec 12, 2024  8:47 AM [name] wrote: Reason for CRM: Patients daughter called in to advise that she scheduled patient an  appointment at the earliest one available but would like to know if patient can get a temp refill for now until she is seen.  Scheduled for 01/09/25 at 2pm  Medication: levothyroxine  (SYNTHROID ) 50 MCG tablet  May you please advise. >> Dec 12, 2024  8:52 AM [name] wrote: Patient is also needs : atorvastatin  (LIPITOR) 10 MG tablet

## 2025-01-07 ENCOUNTER — Ambulatory Visit: Payer: Medicare HMO

## 2025-01-09 ENCOUNTER — Encounter: Admitting: Family Medicine
